# Patient Record
Sex: Female | Born: 1935 | Race: White | Hispanic: No | Smoking: Never smoker
Health system: Southern US, Community
[De-identification: ages and names within clinical notes are randomized; demographics above are authoritative.]

## PROBLEM LIST (undated history)

## (undated) DIAGNOSIS — K219 Gastro-esophageal reflux disease without esophagitis: Secondary | ICD-10-CM

## (undated) DIAGNOSIS — K5792 Diverticulitis of intestine, part unspecified, without perforation or abscess without bleeding: Secondary | ICD-10-CM

## (undated) DIAGNOSIS — K429 Umbilical hernia without obstruction or gangrene: Secondary | ICD-10-CM

## (undated) DIAGNOSIS — T7840XA Allergy, unspecified, initial encounter: Secondary | ICD-10-CM

## (undated) DIAGNOSIS — C541 Malignant neoplasm of endometrium: Secondary | ICD-10-CM

## (undated) DIAGNOSIS — D649 Anemia, unspecified: Secondary | ICD-10-CM

## (undated) DIAGNOSIS — G20 Parkinson's disease: Secondary | ICD-10-CM

## (undated) DIAGNOSIS — R251 Tremor, unspecified: Secondary | ICD-10-CM

## (undated) DIAGNOSIS — H269 Unspecified cataract: Secondary | ICD-10-CM

## (undated) DIAGNOSIS — K279 Peptic ulcer, site unspecified, unspecified as acute or chronic, without hemorrhage or perforation: Secondary | ICD-10-CM

## (undated) DIAGNOSIS — G20A1 Parkinson's disease without dyskinesia, without mention of fluctuations: Secondary | ICD-10-CM

## (undated) DIAGNOSIS — E785 Hyperlipidemia, unspecified: Secondary | ICD-10-CM

## (undated) DIAGNOSIS — I10 Essential (primary) hypertension: Secondary | ICD-10-CM

## (undated) DIAGNOSIS — F419 Anxiety disorder, unspecified: Secondary | ICD-10-CM

## (undated) DIAGNOSIS — C50919 Malignant neoplasm of unspecified site of unspecified female breast: Secondary | ICD-10-CM

## (undated) DIAGNOSIS — F32A Depression, unspecified: Secondary | ICD-10-CM

## (undated) DIAGNOSIS — F329 Major depressive disorder, single episode, unspecified: Secondary | ICD-10-CM

## (undated) HISTORY — DX: Malignant neoplasm of endometrium: C54.1

## (undated) HISTORY — DX: Major depressive disorder, single episode, unspecified: F32.9

## (undated) HISTORY — DX: Anxiety disorder, unspecified: F41.9

## (undated) HISTORY — DX: Essential (primary) hypertension: I10

## (undated) HISTORY — DX: Allergy, unspecified, initial encounter: T78.40XA

## (undated) HISTORY — DX: Gastro-esophageal reflux disease without esophagitis: K21.9

## (undated) HISTORY — DX: Hyperlipidemia, unspecified: E78.5

## (undated) HISTORY — DX: Depression, unspecified: F32.A

## (undated) HISTORY — DX: Malignant neoplasm of unspecified site of unspecified female breast: C50.919

## (undated) HISTORY — DX: Anemia, unspecified: D64.9

## (undated) HISTORY — DX: Unspecified cataract: H26.9

## (undated) HISTORY — DX: Umbilical hernia without obstruction or gangrene: K42.9

## (undated) HISTORY — DX: Parkinson's disease: G20

## (undated) HISTORY — DX: Diverticulitis of intestine, part unspecified, without perforation or abscess without bleeding: K57.92

## (undated) HISTORY — DX: Parkinson's disease without dyskinesia, without mention of fluctuations: G20.A1

## (undated) HISTORY — PX: OTHER SURGICAL HISTORY: SHX169

## (undated) HISTORY — DX: Peptic ulcer, site unspecified, unspecified as acute or chronic, without hemorrhage or perforation: K27.9

## (undated) HISTORY — DX: Tremor, unspecified: R25.1

---
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
## 1995-09-05 HISTORY — PX: AXILLARY LYMPH NODE DISSECTION: SHX5229

## 1995-09-05 HISTORY — PX: BREAST LUMPECTOMY: SHX2

## 2005-09-04 HISTORY — PX: UMBILICAL HERNIA REPAIR: SHX196

## 2005-10-26 ENCOUNTER — Ambulatory Visit: Payer: Self-pay | Admitting: Surgery

## 2005-11-09 ENCOUNTER — Other Ambulatory Visit: Payer: Self-pay

## 2005-11-09 ENCOUNTER — Ambulatory Visit: Payer: Self-pay | Admitting: Surgery

## 2006-03-23 ENCOUNTER — Other Ambulatory Visit: Payer: Self-pay

## 2006-03-23 ENCOUNTER — Ambulatory Visit: Payer: Self-pay | Admitting: Surgery

## 2006-03-28 ENCOUNTER — Ambulatory Visit: Payer: Self-pay | Admitting: Surgery

## 2006-09-04 HISTORY — PX: COLONOSCOPY: SHX174

## 2006-09-24 ENCOUNTER — Ambulatory Visit: Payer: Self-pay | Admitting: Unknown Physician Specialty

## 2007-02-27 ENCOUNTER — Ambulatory Visit: Payer: Self-pay | Admitting: Unknown Physician Specialty

## 2010-11-11 ENCOUNTER — Encounter (INDEPENDENT_AMBULATORY_CARE_PROVIDER_SITE_OTHER): Payer: Self-pay | Admitting: *Deleted

## 2010-11-15 NOTE — Letter (Signed)
Summary: New Patient letter  [facility]  [address] [place], [place] [address]   Phone: [phone]  Fax: [phone]       11/11/2010 MRN: [id number]  [facility] [address] [place], [place]  [address]  Dear Angela Savage,  Welcome to the Gastroenterology Division at [facility].    You are scheduled to see Dr.  Jarold Motto on 12-13-10 at 2:45P.M. on the 3rd floor at [facility], [address].  We ask that you try to arrive at our office 15 minutes prior to your appointment time to allow for check-in.  We would like you to complete the enclosed self-administered evaluation form prior to your visit and bring it with you on the day of your appointment.  We will review it with you.  Also, please bring a complete list of all your medications or, if you prefer, bring the medication bottles and we will list them.  Please bring your insurance card so that we may make a copy of it.  If your insurance requires a referral to see a specialist, please bring your referral form from your primary care physician.  Co-payments are due at the time of your visit and may be paid by cash, check or credit card.     Your office visit will consist of a consult with your physician (includes a physical exam), any laboratory testing he/she may order, scheduling of any necessary diagnostic testing (e.g. x-ray, ultrasound, CT-scan), and scheduling of a procedure (e.g. Endoscopy, Colonoscopy) if required.  Please allow enough time on your schedule to allow for any/all of these possibilities.    If you cannot keep your appointment, please call [phone] to cancel or reschedule prior to your appointment date.  This allows [place] the opportunity to schedule an appointment for another patient in need of care.  If you do not cancel or reschedule by 5 p.m. the business day prior to your appointment date, you will be charged a $50.00 late cancellation/no-show fee.    Thank you for  choosing [facility] for your medical needs.  We appreciate the opportunity to care for you.  Please visit [place] at our website  to learn more about our practice.                     Sincerely,                                                             The Gastroenterology Division

## 2010-11-15 NOTE — Letter (Signed)
Summary: New Patient letter  [facility]  [address] [place], [place] [address]   Phone: [phone]  Fax: [phone]       11/11/2010 MRN: [id number]  [facility] [address] [place], [place]  [address]  Dear Ms. Bangert,  Welcome to the Gastroenterology Division at [facility].    You are scheduled to see Dr.  Leone Payor on 12-28-10 at 2:30P.M. on the 3rd floor at [facility], [address].  We ask that you try to arrive at our office 15 minutes prior to your appointment time to allow for check-in.  We would like you to complete the enclosed self-administered evaluation form prior to your visit and bring it with you on the day of your appointment.  We will review it with you.  Also, please bring a complete list of all your medications or, if you prefer, bring the medication bottles and we will list them.  Please bring your insurance card so that we may make a copy of it.  If your insurance requires a referral to see a specialist, please bring your referral form from your primary care physician.  Co-payments are due at the time of your visit and may be paid by cash, check or credit card.     Your office visit will consist of a consult with your physician (includes a physical exam), any laboratory testing he/she may order, scheduling of any necessary diagnostic testing (e.g. x-ray, ultrasound, CT-scan), and scheduling of a procedure (e.g. Endoscopy, Colonoscopy) if required.  Please allow enough time on your schedule to allow for any/all of these possibilities.    If you cannot keep your appointment, please call [phone] to cancel or reschedule prior to your appointment date.  This allows [place] the opportunity to schedule an appointment for another patient in need of care.  If you do not cancel or reschedule by 5 p.m. the business day prior to your appointment date, you will be charged a $50.00 late cancellation/no-show fee.    Thank you for  choosing [facility] for your medical needs.  We appreciate the opportunity to care for you.  Please visit [place] at our website  to learn more about our practice.                     Sincerely,                                                             The Gastroenterology Division

## 2010-12-13 ENCOUNTER — Ambulatory Visit: Payer: Self-pay | Admitting: Gastroenterology

## 2010-12-28 ENCOUNTER — Ambulatory Visit (INDEPENDENT_AMBULATORY_CARE_PROVIDER_SITE_OTHER): Payer: Medicare Other | Admitting: Internal Medicine

## 2010-12-28 ENCOUNTER — Other Ambulatory Visit (INDEPENDENT_AMBULATORY_CARE_PROVIDER_SITE_OTHER): Payer: Medicare Other

## 2010-12-28 ENCOUNTER — Encounter: Payer: Self-pay | Admitting: Internal Medicine

## 2010-12-28 VITALS — BP 116/76 | HR 96 | Ht 63.0 in | Wt 148.0 lb

## 2010-12-28 DIAGNOSIS — K921 Melena: Secondary | ICD-10-CM

## 2010-12-28 DIAGNOSIS — R1032 Left lower quadrant pain: Secondary | ICD-10-CM

## 2010-12-28 DIAGNOSIS — R634 Abnormal weight loss: Secondary | ICD-10-CM

## 2010-12-28 LAB — COMPREHENSIVE METABOLIC PANEL
ALT: 15 U/L (ref 0–35)
AST: 20 U/L (ref 0–37)
Albumin: 3.9 g/dL (ref 3.5–5.2)
CO2: 30 mEq/L (ref 19–32)
Calcium: 9.5 mg/dL (ref 8.4–10.5)
Chloride: 96 mEq/L (ref 96–112)
Creatinine, Ser: 0.7 mg/dL (ref 0.4–1.2)
GFR: 83.61 mL/min (ref >60.00)
Potassium: 3.5 mEq/L (ref 3.5–5.1)
Sodium: 138 mEq/L (ref 135–145)
Total Protein: 7 g/dL (ref 6.0–8.3)

## 2010-12-28 LAB — CBC WITH DIFFERENTIAL/PLATELET
Basophils Absolute: 0 10*3/uL (ref 0.0–0.1)
Hemoglobin: 13.6 g/dL (ref 12.0–15.0)
Lymphocytes Relative: 36.4 % (ref 12.0–46.0)
Monocytes Relative: 8.5 % (ref 3.0–12.0)
Neutro Abs: 5.3 10*3/uL (ref 1.4–7.7)
Neutrophils Relative %: 53.6 % (ref 43.0–77.0)
RDW: 13.6 % (ref 11.5–14.6)

## 2010-12-28 NOTE — Progress Notes (Signed)
Subjective:    Patient ID: Angela Savage, female    DOB: [birth year], 77 y.o.   MRN: [id number]  HPI This is a very pleasant 77 year old white woman with chronic recurrent left lower quadrant pain. She clearly remembers developing problems after an umbilical hernia repair in 2007. She waited the appropriate several months after her August 2007 repair and then was doing some yard work and remembers feeling something tearing in her abdomen. At some point after that she was diagnosed with diverticulitis though it took a pelvic ultrasound to do so. She was well after antibiotic treatment once or twice for a number of years until the past several months where she began to have problems again. It should be noted that she had a colonoscopy in 2008 after her diverticulitis attack was suspected and diagnosed but there were no diverticula seen at the colonoscopy.  There is a very focal area of left lower quadrant pain and tenderness. She says when pressing on that area she will subsequently developed rectal bleeding or hematochezia at times. Her bowel habits have generally been regular without any problem. When the pain comes she gets low-grade fevers and chills and feels fairly often she says. When she does take antibiotics to improve and usually resolve things. She says she is terribly allergic to Cipro sulfa and Flagyl will help. She had seen a surgeon, Dr. Katrinka Blazing in [place] recently and he was discussing the possibility of a segmental colon resection it sounds like. I believe a CT scan was recommended that she has not pursued that and had not had one in some time. He is concerned that she might have cancer again having had breast cancer and what I think is endometrial cancer. The last antibiotic she had were in early March.  Past medical history, past surgical history, family history and social history reviewed and reflected in the database. She does report that her family is cared for by me, she is retired  but still very active and cares for her own house and drives etc. 4 sons 2 daughters.  Review of Systems She feels fatigued at times, low-grade temperature elevations the number not given, all other review of systems reviewed and reported as negative.    Objective:   Physical Exam Present N. well-developed overweight elderly white woman no acute distress, vigorous overall. Eyes anicteric pupils round and reactive to light Mouth posterior pharynx clear lesions she does have dentures Neck supple no mass thyromegaly Lungs clear Heart S1-S2 no murmurs Abdomen is protuberant somewhat obese soft there is a focal area of tenderness in the left lower quadrant area towards the pelvis. She is tender and there is some guarding there. There is no change in the pain with manipulation of the hip or lower extremity i.e. it does not cause that. Rectal exam is deferred. Extremities free of edema She is oriented x3 Mood and affect are appropriate        Assessment & Plan:  #1 is left lower quadrant pain  Etiology of this is not clear. It certainly could be some type of occult diverticulitis it is hard to prove. At this point repeat CT scanning as the first step I believe. If that is unrevealing a colonoscopy will be pursued because of the hematochezia issues as well. If it does show diverticulitis I would treat with a regimen again. If she has diverticulitis on a recurrent basis segmental resection seems reasonable although she was older and we would try to avoid this. To me, it  is really not clear what the cause of her problems are and we will need to investigate further.  #2 blood in stool colonoscopy is anticipated pending the CT scan results she understands and is aware  Cc: Dr. Lacie Scotts

## 2010-12-28 NOTE — Patient Instructions (Signed)
Please got to the basement lab today after checking out for labs. You have been scheduled for a CT scan at [facility] care on [facility]. Instructions have been provided.  Please stop the Metformin the day of your procedure and do not start again until 48 hours later.

## 2010-12-30 ENCOUNTER — Ambulatory Visit (INDEPENDENT_AMBULATORY_CARE_PROVIDER_SITE_OTHER)
Admission: RE | Admit: 2010-12-30 | Discharge: 2010-12-30 | Disposition: A | Discharge status: Home / Self Care | Payer: Medicare Other | Source: Ambulatory Visit | Attending: Internal Medicine | Admitting: Internal Medicine

## 2010-12-30 DIAGNOSIS — K921 Melena: Secondary | ICD-10-CM

## 2010-12-30 DIAGNOSIS — R1032 Left lower quadrant pain: Secondary | ICD-10-CM

## 2010-12-30 DIAGNOSIS — R634 Abnormal weight loss: Secondary | ICD-10-CM

## 2010-12-30 MED ORDER — IOHEXOL 300 MG/ML  SOLN
100.0000 mL | Freq: Once | INTRAMUSCULAR | Status: AC | PRN
  Administered 2010-12-30: 100 mL via INTRAVENOUS

## 2011-01-03 ENCOUNTER — Telehealth: Payer: Self-pay

## 2011-01-03 ENCOUNTER — Encounter: Payer: Self-pay | Admitting: Internal Medicine

## 2011-01-03 DIAGNOSIS — K921 Melena: Secondary | ICD-10-CM

## 2011-01-03 NOTE — Telephone Encounter (Signed)
Message copied by Darcey Nora on Tue Jan 03, 2011  9:58 AM ------      Message from: Stan Head      Created: Fri Dec 30, 2010 12:16 PM       No diverticulitis evident.      Suspected capsule in the left colon (see report).      I need her to schedule a colonoscopy to evaluate LLQ pain and she has also had blood in stool 578.1

## 2011-01-03 NOTE — Telephone Encounter (Signed)
Patient advised she is scheduled for colon 02/01/11 1:30, pre-visit 01/23/11 10:30

## 2011-01-03 NOTE — Telephone Encounter (Signed)
Message copied by Darcey Nora on Tue Jan 03, 2011 10:03 AM ------      Message from: Stan Head      Created: Fri Dec 30, 2010 12:16 PM       No diverticulitis evident.      Suspected capsule in the left colon (see report).      I need her to schedule a colonoscopy to evaluate LLQ pain and she has also had blood in stool 578.1

## 2011-01-03 NOTE — Telephone Encounter (Signed)
Message copied by Darcey Nora on Tue Jan 03, 2011 10:02 AM ------      Message from: Stan Head      Created: Fri Dec 30, 2010 12:16 PM       No diverticulitis evident.      Suspected capsule in the left colon (see report).      I need her to schedule a colonoscopy to evaluate LLQ pain and she has also had blood in stool 578.1

## 2011-01-04 ENCOUNTER — Encounter: Payer: Self-pay | Admitting: Internal Medicine

## 2011-01-23 ENCOUNTER — Ambulatory Visit (AMBULATORY_SURGERY_CENTER): Payer: Medicare Other | Admitting: *Deleted

## 2011-01-23 ENCOUNTER — Encounter: Payer: Self-pay | Admitting: Internal Medicine

## 2011-01-23 VITALS — Ht 62.0 in | Wt 147.5 lb

## 2011-01-23 DIAGNOSIS — K921 Melena: Secondary | ICD-10-CM

## 2011-01-23 MED ORDER — PEG-KCL-NACL-NASULF-NA ASC-C 100 G PO SOLR: 1.0000 | Freq: Once | ORAL | Status: AC

## 2011-01-23 NOTE — Progress Notes (Signed)
Pt states she gave Dr. Leone Payor her previous colonoscopy report at her office visit with him on 12-28-10.

## 2011-02-01 ENCOUNTER — Other Ambulatory Visit: Payer: Medicare Other | Admitting: Internal Medicine

## 2011-03-02 ENCOUNTER — Other Ambulatory Visit: Payer: Medicare Other | Admitting: Internal Medicine

## 2011-03-07 ENCOUNTER — Other Ambulatory Visit: Payer: Medicare Other | Admitting: Internal Medicine

## 2011-10-31 ENCOUNTER — Ambulatory Visit: Payer: Medicare Other | Admitting: Internal Medicine

## 2011-10-31 DIAGNOSIS — Z0289 Encounter for other administrative examinations: Secondary | ICD-10-CM

## 2014-03-19 ENCOUNTER — Inpatient Hospital Stay: Payer: Self-pay | Admitting: Specialist

## 2014-03-19 LAB — CBC WITH DIFFERENTIAL/PLATELET
BASOS PCT: 0.7 %
Basophil #: 0.1 10*3/uL (ref 0.0–0.1)
Eosinophil #: 0.1 10*3/uL (ref 0.0–0.7)
Eosinophil %: 0.6 %
HCT: 36.8 % (ref 35.0–47.0)
HGB: 12.2 g/dL (ref 12.0–16.0)
LYMPHS ABS: 1.6 10*3/uL (ref 1.0–3.6)
Lymphocyte %: 12.4 %
MCH: 27.9 pg (ref 26.0–34.0)
MCHC: 33.1 g/dL (ref 32.0–36.0)
MCV: 84 fL (ref 80–100)
MONOS PCT: 6.4 %
Monocyte #: 0.8 x10 3/mm (ref 0.2–0.9)
NEUTROS PCT: 79.9 %
Neutrophil #: 10.2 10*3/uL — ABNORMAL HIGH (ref 1.4–6.5)
PLATELETS: 321 10*3/uL (ref 150–440)
RBC: 4.36 10*6/uL (ref 3.80–5.20)
RDW: 13.9 % (ref 11.5–14.5)
WBC: 12.8 10*3/uL — ABNORMAL HIGH (ref 3.6–11.0)

## 2014-03-19 LAB — COMPREHENSIVE METABOLIC PANEL
ALBUMIN: 3.5 g/dL (ref 3.4–5.0)
ALT: 21 U/L (ref 12–78)
ANION GAP: 11 (ref 7–16)
Alkaline Phosphatase: 55 U/L
BILIRUBIN TOTAL: 0.4 mg/dL (ref 0.2–1.0)
BUN: 15 mg/dL (ref 7–18)
CREATININE: 1.19 mg/dL (ref 0.60–1.30)
Calcium, Total: 8.9 mg/dL (ref 8.5–10.1)
Chloride: 97 mmol/L — ABNORMAL LOW (ref 98–107)
Co2: 28 mmol/L (ref 21–32)
EGFR (Non-African Amer.): 43 — ABNORMAL LOW
GFR CALC AF AMER: 50 — AB
GLUCOSE: 205 mg/dL — AB (ref 65–99)
OSMOLALITY: 279 (ref 275–301)
POTASSIUM: 2.9 mmol/L — AB (ref 3.5–5.1)
SGOT(AST): 27 U/L (ref 15–37)
Sodium: 136 mmol/L (ref 136–145)
TOTAL PROTEIN: 7.5 g/dL (ref 6.4–8.2)

## 2014-03-19 LAB — PROTIME-INR
INR: 0.9
Prothrombin Time: 12.1 secs (ref 11.5–14.7)

## 2014-03-19 LAB — APTT: ACTIVATED PTT: 29.7 s (ref 23.6–35.9)

## 2014-03-19 LAB — TROPONIN I

## 2014-03-19 LAB — MAGNESIUM: MAGNESIUM: 1.4 mg/dL — AB

## 2014-03-20 LAB — URINALYSIS, COMPLETE
BILIRUBIN, UR: NEGATIVE
Bacteria: NONE SEEN
Glucose,UR: NEGATIVE mg/dL (ref 0–75)
Nitrite: NEGATIVE
PH: 5 (ref 4.5–8.0)
Protein: NEGATIVE
SQUAMOUS EPITHELIAL: NONE SEEN
Specific Gravity: 1.013 (ref 1.003–1.030)

## 2014-03-20 LAB — CBC WITH DIFFERENTIAL/PLATELET
BASOS PCT: 0.4 %
Basophil #: 0 10*3/uL (ref 0.0–0.1)
EOS ABS: 0.2 10*3/uL (ref 0.0–0.7)
EOS PCT: 2.2 %
HCT: 34.3 % — ABNORMAL LOW (ref 35.0–47.0)
HGB: 11.2 g/dL — ABNORMAL LOW (ref 12.0–16.0)
LYMPHS PCT: 26.1 %
Lymphocyte #: 2.7 10*3/uL (ref 1.0–3.6)
MCH: 27.8 pg (ref 26.0–34.0)
MCHC: 32.8 g/dL (ref 32.0–36.0)
MCV: 85 fL (ref 80–100)
MONOS PCT: 10.7 %
Monocyte #: 1.1 x10 3/mm — ABNORMAL HIGH (ref 0.2–0.9)
NEUTROS ABS: 6.4 10*3/uL (ref 1.4–6.5)
Neutrophil %: 60.6 %
Platelet: 280 10*3/uL (ref 150–440)
RBC: 4.04 10*6/uL (ref 3.80–5.20)
RDW: 13.8 % (ref 11.5–14.5)
WBC: 10.5 10*3/uL (ref 3.6–11.0)

## 2014-03-20 LAB — BASIC METABOLIC PANEL
ANION GAP: 4 — AB (ref 7–16)
BUN: 10 mg/dL (ref 7–18)
CALCIUM: 8.4 mg/dL — AB (ref 8.5–10.1)
CO2: 29 mmol/L (ref 21–32)
CREATININE: 0.84 mg/dL (ref 0.60–1.30)
Chloride: 102 mmol/L (ref 98–107)
GLUCOSE: 109 mg/dL — AB (ref 65–99)
Osmolality: 270 (ref 275–301)
Potassium: 3.5 mmol/L (ref 3.5–5.1)
SODIUM: 135 mmol/L — AB (ref 136–145)

## 2014-03-20 LAB — MAGNESIUM: MAGNESIUM: 2.2 mg/dL

## 2014-03-21 LAB — BASIC METABOLIC PANEL
ANION GAP: 7 (ref 7–16)
BUN: 8 mg/dL (ref 7–18)
CALCIUM: 7.8 mg/dL — AB (ref 8.5–10.1)
Chloride: 101 mmol/L (ref 98–107)
Co2: 26 mmol/L (ref 21–32)
Creatinine: 0.88 mg/dL (ref 0.60–1.30)
GLUCOSE: 188 mg/dL — AB (ref 65–99)
Osmolality: 272 (ref 275–301)
Potassium: 3.6 mmol/L (ref 3.5–5.1)
SODIUM: 134 mmol/L — AB (ref 136–145)

## 2014-03-21 LAB — CBC WITH DIFFERENTIAL/PLATELET
BASOS PCT: 0.4 %
Basophil #: 0 10*3/uL (ref 0.0–0.1)
Eosinophil #: 0.1 10*3/uL (ref 0.0–0.7)
Eosinophil %: 1.1 %
HCT: 26.8 % — AB (ref 35.0–47.0)
HGB: 9.1 g/dL — AB (ref 12.0–16.0)
Lymphocyte #: 2.1 10*3/uL (ref 1.0–3.6)
Lymphocyte %: 20.9 %
MCH: 28.8 pg (ref 26.0–34.0)
MCHC: 34 g/dL (ref 32.0–36.0)
MCV: 85 fL (ref 80–100)
MONO ABS: 1.1 x10 3/mm — AB (ref 0.2–0.9)
Monocyte %: 10.9 %
Neutrophil #: 6.8 10*3/uL — ABNORMAL HIGH (ref 1.4–6.5)
Neutrophil %: 66.7 %
PLATELETS: 250 10*3/uL (ref 150–440)
RBC: 3.16 10*6/uL — AB (ref 3.80–5.20)
RDW: 13.7 % (ref 11.5–14.5)
WBC: 10.2 10*3/uL (ref 3.6–11.0)

## 2014-03-22 LAB — HEMOGLOBIN: HGB: 9 g/dL — ABNORMAL LOW (ref 12.0–16.0)

## 2014-11-11 ENCOUNTER — Inpatient Hospital Stay: Payer: Self-pay | Admitting: Internal Medicine

## 2014-12-26 NOTE — Discharge Summary (Signed)
PATIENT NAME:  Angela Savage, BRIAN MR#:  [id number] DATE OF BIRTH:  1931-09-24  DATE OF ADMISSION:  03/19/2014 DATE OF DISCHARGE:  03/24/2014   For detailed note, please see the history and physical done on admission by Dr. Loletha Grayer.  DIAGNOSES AT DISCHARGE:  1. Status post fall and left hip fracture.  2. Hypertension.  3. Diabetes.  4. Anxiety.  5. Osteoporosis.  6. Otitis media.   DIET: The patient is being discharged on a low-sodium, American Diabetic Association diet.   ACTIVITY: As tolerated.   FOLLOWUP: Dr. Earnestine Leys in the next 1-2 weeks. Follow up with patient's primary care physician, Dr. Lahoma Crocker, in the next 1-2 weeks.    DISCHARGE MEDICATIONS: Glimepiride 4 mg b.i.d., Xanax 1 mg 1/2 tab b.i.d., metformin 1000 mg b.i.d., Lantus 20 units at bedtime, lisinopril 2.5 mg at bedtime, metoprolol succinate 25 mg daily, aspirin 325 mg b.i.d., calcium/vitamin D 1 tab b.i.d., Augmentin 875 mg b.i.d. x 3 days, Tylenol with hydrocodone 5/325, 1 tab q.4-6 hours as needed, iron sulfate 325 mg daily, and milk of magnesia 30 mL b.i.d. as needed.   [place] COURSE: Dr. Earnestine Leys, Dr. Thornton Park, orthopedics.   PERTINENT STUDIES DONE DURING THE HOSPITAL COURSE: Are as follows: A CT of the head done without contrast on admission, showing no acute abnormality. An x-ray of the left hip, showing intertrochanteric fracture of the left femur with slight angulation. Chest x-ray on admission showing no acute cardiopulmonary disease.   HOSPITAL COURSE: This is a 81 year old female who presented to the hospital on 03/19/2014 due to a mechanical fall and noted to have a left hip fracture.   1.  Status post fall and left hip fracture. This was likely a mechanical fall that led to the patient's left hip fracture. The patient was seen by orthopedics and underwent open reduction and internal fixation of her left hip. She is currently postoperative day #4. She is  working well with physical therapy. Her pain is well-controlled with some as needed Norco. She is being discharged to a skilled nursing facility for ongoing rehab. She was on Lovenox for DVT prophylaxis, but as per orthopedics, they wanted to discharge her on aspirin twice daily, which she will continue to take for DVT prophylaxis. 2.  Hypokalemia. This was secondary to the hydrochlorothiazide that the patient was taking. Her HCTZ has now been discontinued. Her potassium has since then been supplemented and is now normalized.  3.  Otitis media. The patient has been maintained on some Augmentin. She will continue that for the next 2 or 3 days. She is clinically afebrile and stable.  4.  Diabetes. The patient's blood sugars have remained stable. She will continue her metformin, glimepiride, and Lantus as stated.  5.  Hypertension. The patient's blood pressure was bit on the lower side. Her antihypertensive medications have been adjusted. She will continue to take her low-dose metoprolol and lisinopril and has been taken off the hydrochlorothiazide.  6.  Anxiety. The patient was maintained on her Xanax, and she will continue that.  7.  Osteoporosis. The patient is being discharged on calcium and vitamin D supplements.   The patient is a FULL CODE.   DISPOSITION: She is being discharged to a skilled nursing facility for ongoing rehab.   TIME SPENT: 40 minutes.    ____________________________ Belia Heman. Verdell Carmine, MD vjs:je D: 03/24/2014 10:56:00 ET T: 03/24/2014 11:19:45 ET JOB#: [id number]  cc: Belia Heman. Verdell Carmine, MD, <Dictator> Lahoma Crocker, MD Nadara Mustard  Dinah Beers, MD    Henreitta Leber MD ELECTRONICALLY SIGNED 04/02/2014 14:05

## 2014-12-26 NOTE — H&P (Signed)
PATIENT NAME:  Angela Savage, Angela Savage MR#:  [id number] DATE OF BIRTH:  02/16/1932  DATE OF ADMISSION:  03/19/2014  PRIMARY CARE PHYSICIAN:  Dr. Margret Chance at [facility] in [place].   CHIEF COMPLAINT: Hip pain after a fall.   HISTORY OF PRESENT ILLNESS: This is a 81 year old female who was out pulling weeds in the yard.  She bent over and then got up quickly, got dizzy, everything was spinning. She started leaning towards the left and then fell on her left hip and had pain.  In the ER, she was found to have a left hip fracture, and hospitalist services were contacted for further evaluation.   PAST MEDICAL HISTORY: Diabetes; right breast cancer with lymph node resection treated with lumpectomy, radiation and hormonal therapy; recent kidney infection; hypertension; and tachycardia.   PAST SURGICAL HISTORY: Breast lumpectomy and lymph node resection, hysterectomy, tumor of the bladder, and hernia repair.   ALLERGIES: CIPRO AND CODEINE.   MEDICATIONS: As per prescription writer include aspirin 81 mg daily, glimepiride 4 mg twice a day, hydrochlorothiazide 25 mg daily, Lantus 20 units subcutaneous injection in the morning, lisinopril 10 mg daily, metformin 1000 mg twice a day, metoprolol 500 mg extended-release daily, Xanax 1 mg half tablet twice a day.   SOCIAL HISTORY: No smoking. No alcohol. No drug use.  Used to work in [employer]. Lives with her sister.   FAMILY HISTORY: Father died with a hip fracture and surgery. Mother died of a CVA, also  had hypertension.  REVIEW OF SYSTEMS:  CONSTITUTIONAL: No fever, chills, or sweats. Positive for weakness. No fatigue.  EYES: She does wear glasses.  EARS, NOSE, MOUTH AND THROAT: Decreased hearing. Positive for postnasal drip. No sore throat. No difficulty swallowing.  CARDIOVASCULAR: No chest pain. No palpitations.  RESPIRATORY: Positive for cough, whitish phlegm.  GASTROINTESTINAL: No nausea. No vomiting. No abdominal pain. No diarrhea. No  constipation. No bright red blood per rectum. No melena.  GENITOURINARY: No burning on urination. No hematuria.  MUSCULOSKELETAL: Positive for hip pain.  INTEGUMENT: No rashes or eruptions.  NEUROLOGIC: No fainting or blackouts.  PSYCHIATRIC: No anxiety or depression.  ENDOCRINE: No thyroid problems.  HEMATOLOGIC AND LYMPHATIC: No anemia. No easy bruising or bleeding.   PHYSICAL EXAMINATION:  VITAL SIGNS: Temperature 98.1, pulse 102, respirations 18, blood pressure 136/86, pulse oximetry 94% on room air.  GENERAL: No respiratory distress.  EYES: Conjunctivae and lids normal. Pupils equal, round, and reactive to light. Extraocular muscles intact. No nystagmus.  EARS, NOSE, MOUTH AND THROAT: Tympanic membrane bulging and erythematous bilaterally. Nasal mucosa: No erythema.  THROAT: Slight erythema, no exudate seen.  LIPS AND GUMS: No lesions.  NECK: No JVD. No bruits. No lymphadenopathy. No thyromegaly. No thyroid nodules palpated.  LUNGS: Clear to auscultation. No use of accessory muscles to breathe. No rhonchi rales, or wheeze heard.  CARDIOVASCULAR: S1, S2 normal.  Positive 2/6 systolic ejection murmur. Carotid upstroke 2+ bilaterally. No bruits.  EXTREMITIES: Dorsalis pedis pulses 2+ bilaterally. No edema of the lower extremity.  ABDOMEN: Soft, nontender. No organosplenomegaly. Normoactive bowel sounds. No masses felt.  LYMPHATIC: No lymph nodes in the neck.  MUSCULOSKELETAL: No clubbing, edema, or cyanosis.  SKIN: No ulcers or lesions seen.  NEUROLOGICAL: Cranial nerves II-XII grossly intact. Deep tendon reflexes not tested with hip fracture.  PSYCHIATRIC: The patient is oriented to person, place, and time.   LABORATORY AND RADIOLOGICAL DATA: CT scan of the head was negative. Chest x-ray negative. Left hip showed a hip  fracture. PT, INR and PTT normal range. White blood cell count 12.8, hemoglobin and hematocrit 12.2 and 36.8, platelet count of 321,000. Glucose 205, BUN 15, creatinine  1.19, sodium 136, potassium 2.9, chloride 97, CO2 28, calcium 8.9. Liver function tests normal range. Troponin negative.   EKG: No acute changes.   ASSESSMENT AND PLAN:  1.  Preoperative evaluation for left hip fracture. I see no contraindications to do surgery at this time. The patient is willing to undergo all of the risks of surgery in order to walk again, as soon as possible. The patient is on weight-bearing bones, especially hips.  Must go to the operating room to avoid complications of skin breakdown and deep venous thrombosis, pneumonia, bronchitis, and death if they do not get up and walk again. The patient is willing to go for the procedure.   2.  Hypokalemia. I will stop hydrochlorothiazide, likely the cause of the patient's hypokalemia. I will check a magnesium and replace that if low.  I will give potassium in IV fluids and oral potassium.   3.  Otitis media. We will give Augmentin b.i.d. .  Likely the cause of her dizziness and fall.   4.  Diabetes. We will hold diabetic medications since the patient may end up going to the operating room and just cover with sliding scale at this point.   5.  Hypertension. Continue metoprolol.   6.  Tachycardia.  Continue metoprolol. Tachycardia could also be secondary to pain.   TIME SPENT ON ADMISSION: 55 minutes.   CODE STATUS: The Patient Is A Full Code, But Does Not Want To Be On Life Support Long-Term.   ____________________________ Tana Conch. Leslye Peer, MD rjw:ts D: 03/19/2014 17:37:37 ET T: 03/19/2014 18:10:05 ET JOB#: [id number]  cc: Tana Conch. Leslye Peer, MD, <Dictator> Dr. Waylan Rocher at [facility], [place]. Marisue Brooklyn MD ELECTRONICALLY SIGNED 03/20/2014 12:37

## 2014-12-26 NOTE — Consult Note (Signed)
Brief Consult Note: Diagnosis: Left intertrochanteric hip fracture.   Patient was seen by consultant.   Recommend to proceed with surgery or procedure.   Recommend further assessment or treatment.   Orders entered.   Discussed with Attending MD.   Comments: 81 year old female fell today injuring the left hip.  Brought to Emergency Room where exam and X-rays show a base neck/intertrochanteric fracture left hip.  Admitted for medical evaluation and surgery.  Discussed surgery with patient and family. Risks and benefits of surgery were discussed at length including but not limited to infection, non union, nerve or blood vessed damage, non union, need for repeat surgery, blood clots and lung emboli, and death.. Plan surgery tomorrow if cleared.   Exam:  Alert and comfortable.  circulation/sensation/motor function good. Pain with range of motion.  Short and rotated.  Skin intact.  No other complaints.   X-rays:  as above  Imp:  Displaced intertrochanteric fracture left hip  Rx:  open reduction and internal fixation left hip.  Electronic Signatures: Park Breed (MD)  (Signed 16-Jul-15 18:13)  Authored: Brief Consult Note   Last Updated: 16-Jul-15 18:13 by Park Breed (MD)

## 2014-12-26 NOTE — Op Note (Signed)
PATIENT NAME:  Angela Savage, Angela Savage MR#:  [id number] DATE OF BIRTH:  06-25-1932  DATE OF PROCEDURE:  03/20/2014  PREOPERATIVE DIAGNOSIS: Displaced intertrochanteric/base neck fracture, left hip.   POSTOPERATIVE DIAGNOSIS:  Displaced intertrochanteric/base neck fracture, left hip.   PROCEDURE PERFORMED: Open reduction internal fixation left hip with a Synthes dynamic hip compression screw (145 degrees/4 hole plate, 90 mm lag screw, 4 cortical screws).   SURGEON: Park Breed, MD.   ANESTHESIA: Spinal.   COMPLICATIONS: None.   DRAINS: Two Hemovacs.   ESTIMATED BLOOD LOSS: 200 mL.  REPLACEMENT: None.   DESCRIPTION OF PROCEDURE: The patient was brought to the operating room where she underwent satisfactory spinal anesthesia, was placed on the fracture table. The right leg was flexed and abducted and her left leg was placed in traction and internally rotated. Fluoroscopy showed excellent reduction of the fracture. The hip was prepped and draped in sterile fashion and longitudinal incision was made along the proximal left thigh. Dissection was carried out sharply through the subcutaneous tissue and fascia and the vastus lateralis muscle was elevated off the femur from posteriorly. A quarter-inch drill hole was made in the lateral femur and a guidepin inserted at 145 degrees into good position of the head and neck. The step cut reamer was used to enlarge the opening. A 90 mm compression screw with a 145 degree 4-hole plate was inserted. The plate was affixed to the shaft with 4 screws. Traction was released and compression screw inserted to tighten the fracture. The fluoroscopy showed good position of the hardware and fracture on AP and lateral views. The wound was irrigated. The fascia was closed with #2  Quill over a medium Hemovac and the subcutaneous tissue was closed with 0 Quill over another Hemovac with irrigation at each level. The skin was closed with staples. Dry sterile dressing was applied  and the Hemovac was activated. The patient was taken out of traction. She had good motion of the hip without crepitus. She was transferred to her hospital bed and taken to recovery in good condition.    ____________________________ Park Breed, MD hem:ds D: 03/20/2014 15:01:45 ET T: 03/20/2014 22:15:14 ET JOB#: [id number]  cc: Park Breed, MD, <Dictator> Park Breed MD ELECTRONICALLY SIGNED 03/22/2014 22:34

## 2015-01-03 NOTE — Discharge Summary (Signed)
PATIENT NAME:  Angela Savage, Angela Savage MR#:  [id number] DATE OF BIRTH:  1932-08-26  DATE OF ADMISSION:  11/11/2014 DATE OF DISCHARGE:  11/13/2014  ADMITTING DIAGNOSIS: Acute pancreatitis.  DISCHARGE DIAGNOSES:  1.  Systemic inflammatory response syndrome due to acute pancreatitis.  2.  Acute pancreatitis of unclear etiology.  3.  Recurrent right-sided abdominal pain with HIDA scan revealing abnormal low gallbladder ejection fraction with patent cystic duct.  4.  Leukocytosis, not otherwise specified.  5.  Hypokalemia. 6.  History of diabetes mellitus, type 2, without complications, insulin-dependent.  7.  Essential hypertension.  8.  Chronic tremors.   DISCHARGE CONDITION: Stable.   DISCHARGE MEDICATIONS: The patient is to resume: Glimepiride 4 mg twice daily, metformin 1 gram twice daily, Lantus insulin 20 units subcutaneously daily, lisinopril 2.5 mg p.o. at bedtime, calcium with vitamin D 500/200 one tablet twice daily with meals, metoprolol succinate 25 mg twice daily, acetaminophen/hydrocodone 325 mg/5 mg 1 tablet every 4-6 hours as needed, iron sulfate 325 mg p.o. daily, magnesium hydroxide 8% oral suspension 30 mL twice daily as needed, aspirin 325 mg twice daily, Xanax 1 mg 1/2 tablet twice daily, and Augmentin 875/125 mg 1 tablet twice daily for 10 days.   HOME OXYGEN: None.   DIET: Two gram salt, low-fat, low-cholesterol, carbohydrate -controlled diet, regular consistency.   ACTIVITY LIMITATIONS: As tolerated.   FOLLOWUP APPOINTMENT: Dr. Lahoma Crocker in 2 days after discharge, Dr. Vira Agar in 1 week after discharge, and Dr. Pat Patrick in 1 week after discharge.    CONSULTANTS: Care management, social work.   RADIOLOGIC STUDIES: CT scan of abdomen and pelvis with contrast, 11/10/2014, showing infiltration around the head of the pancreas suggesting early changes of acute pancreatitis. No fluid collections were noted. Nonspecific gallbladder distention. No bile duct dilatation was noted.  Ultrasound of right upper quadrant, 11/11/2014, showing unremarkable right upper quadrant ultrasound. HIDA scan results not yet as documented by the radiologist; however, according to discussion with radiologist, the patient does have patent cystic duct and abnormally low, 8% ejection fraction.   HOSPITAL COURSE: The patient is an 81 year old Caucasian female with a past history significant for a history diverticulitis in the past, who presents to the hospital with complaints of periumbilical abdominal pain associated nausea and vomiting. Please refer to Dr. Ephriam Jenkins admission note on 11/11/2014. On arrival to the hospital, the patient's temperature was 97.9, pulse was 74, respiration rate was 22, blood pressure 160/86, saturation was 94% on room air. Physical exam revealed mild diffuse tenderness in the abdomen, but no guarding, no rigidity. Bowel sounds were present and equal. Otherwise, physical exam was unremarkable. The patient's lab data done on arrival to the Emergency Room showed elevated glucose level of 213, potassium of 3.1, otherwise BMP was unremarkable. The patient's lipase level was elevated at 784. Lactic acid level was 2.1. The patient's liver enzymes revealed mild elevation of AST to 48, otherwise unremarkable liver enzyme testing. Troponin was less than 0.03. White blood cell count was elevated at 17.8, hemoglobin was 12.1, and platelet count was 297,000, absolute neutrophil count was elevated at 14.6. Urinalysis was remarkable for 1+ blood, negative for protein, no nitrites, trace leukocyte esterase, 5 red blood cells, 3 white blood cells, no bacteria were seen. EKG showed atrial flutter at 79 beats per minutes with variable AV block; septal infarct, age undetermined; abnormal EKG. The patient was admitted to the hospital for further evaluation. She was started on IV fluids and her condition overall improved as time progressed. She was kept  n.p.o. because of acute pancreatitis. When her lipase  level decreased to normal at 33 on 11/12/2014, she was introduced to a clear liquid diet. She tolerated a clear liquid diet well; however, when she was placed on a regular diet she developed recurrent right-sided going to the left side abdominal discomfort. At that point, she had an HIDA scan done, which showed abnormally low ejection fraction. It was felt that the patient has had pancreatitis, which resolved. She was advised, however, to follow up with Dr. Vira Agar as well as surgeon for further recommendations in regard to her abnormally low gallbladder ejection fraction, possible gallbladder dyskinesia, which is possibly the cause of the patient's recurrent abdominal pains.   In regard to white blood cell count elevation, the patient is to continue antibiotic therapy for 8 days to complete a 10-day course although, again, there was no indication that there was any infection in her abdomen. However, according to the patient, she requested antibiotic because of concern of possible recurrent diverticulitis.   In regard to hypokalemia, patient's potassium level was supplemented and it normalized. For her chronic medical problems such as diabetes, hypertension, and chronic tremors, the patient is to continue her outpatient medications, no changes were made. The patient is being discharged in stable condition with the above-mentioned medications and followup. On the day of discharge, temperature was 98.5, pulse was 93, respiration rate was 20, blood pressure 156/82, saturation was 94% on room air at rest.   TIME SPENT: 40 minutes.    ____________________________ Theodoro Grist, MD rv:bm D: 11/13/2014 19:28:16 ET T: 11/14/2014 07:28:44 ET JOB#: [id number]  cc: Theodoro Grist, MD, <Dictator> Manya Silvas, MD Micheline Maze, MD Lahoma Crocker, MD [place] MD ELECTRONICALLY SIGNED 12/06/2014 16:16

## 2015-01-03 NOTE — H&P (Signed)
PATIENT NAME:  Angela Savage, Angela Savage MR#:  [id number] DATE OF BIRTH:  Sep 23, 1931  DATE OF ADMISSION:  11/11/2014  REFERRING PHYSICIAN:  Valli Glance. Owens Shark, MD   PRIMARY CARE PRACTITIONER:  Myles Rosenthal. Mendel Ryder, MD  ADMITTING PHYSICIAN:  Juluis Mire, MD   CHIEF COMPLAINT:  Periumbilical abdominal pain with associated nausea and vomiting.   HISTORY OF PRESENT ILLNESS: An 81 year old Caucasian female with a past medical history of hypertension, diabetes mellitus type 2, angina, osteoporosis, and chronic tremors, who presents to the Emergency Room with the complaints of periumbilical abdominal pain with associated nausea and vomiting, which started around 4:00 p.m. yesterday. The patient states that she has been having some issues with constipation, for which she took [place] the previous night, following which she just had a desire to go to the bathroom. She went to use the restroom to have a bowel movement and during that time she noticed severe periumbilical abdominal pain with associated nausea and vomiting, hence was brought to the Emergency Room for further evaluation. Denies any fever or chills.   In the Emergency Room, the patient was evaluated by the ED physician and was noted to have labs significant for elevated lipase and a CT of the abdomen and the pelvis consistent with peripancreatic edema suggestive of early pancreatitis. The patient was given IV pain medications and IV fluids, following which her abdominal pain is currently under control. Denies any history of fever or chills. No blood in the stools. No chest pain. No shortness of breath. No cough. No palpitations. No dizziness. She has had some chronic frequency of urination for the past few months. Denies any dysuria.   PAST MEDICAL HISTORY: 1.  Hypertension.  2.  Diabetes mellitus type 2.  3.  Questionable angina.  4.  Chronic tremors.  5.  History of right breast cancer status post radiation and surgery in the past.   PAST  SURGICAL HISTORY: 1.  Hip replacement.  2.  Right breast surgery.  3.  Hysterectomy.   ALLERGIES:  CIPRO AND CODEINE.   FAMILY HISTORY:  Father died from complications from hip surgery and mother with hypertension and CVA.   SOCIAL HISTORY:  She lives alone at home. [employer]. Denies any history of smoking, alcohol, or substance abuse.   HOME MEDICATIONS:  1.  Aspirin 325 mg 1 tablet orally once a day.  2.  Calcium with vitamin D 1 tablet once a day.  3.  Ferrous sulfate 325 mg 1 tablet orally once a day.  4.  Glimepiride 4 mg oral tablet 1 tablet 2 times a day.  5.  Lantus 20 units subcutaneously once a day.  6.  Lisinopril 2.5 mg 1 tablet orally once a day.  7.  Magnesium hydroxide oral suspension as needed.  8.  Metformin 1000 mg tablet 1 tablet orally 2 times a day.  9.  Metoprolol succinate 25 mg oral tablet 1 tablet orally once a day.  10.  Xanax 1 mg tablet 0.5 tablet orally 2 times a day.    REVIEW OF SYSTEMS: CONSTITUTIONAL:  Negative for fever or chills. No fatigue. No generalized weakness.  EYES:  Negative for blurred vision or double vision. No pain. No redness. No discharge.  EARS, NOSE, AND THROAT:  Negative for tinnitus, ear pain, hearing loss, epistaxis, or nasal discharge.  RESPIRATORY:  Negative for cough, wheezing, dyspnea, hemoptysis, or painful respiration.  CARDIOVASCULAR:  Negative for chest pain, palpitations, dizziness, syncopal episodes, orthopnea, dyspnea on exertion, or pedal  edema.  GASTROINTESTINAL:  Positive for nausea, vomiting, and periumbilical abdominal pain as noted in the history of present illness. No hematemesis. No melena. No rectal bleeding.  GENITOURINARY:  Negative for dysuria. History of chronic frequency and urgency of urination present.  ENDOCRINE:  Negative for polyuria, nocturia, or heat or cold intolerance.  HEMATOLOGIC AND LYMPHATIC:  Negative for anemia, easy bruising or bleeding, or swollen glands.  INTEGUMENTARY:  Negative  for acne, skin rash, or lesions.  MUSCULOSKELETAL:  Negative for neck or back pain. No history of arthritis or gout. History of chronic tremors present.  NEUROLOGICAL:  Negative for focal weakness or numbness. History of chronic tremors present. No history of CVA or TIA.  PSYCHIATRIC:  Negative for anxiety, insomnia, or depression.   PHYSICAL EXAMINATION: VITAL SIGNS:  Temperature 97.9 degrees Fahrenheit, pulse rate 74 per minute, respirations 22 per minute, blood pressure 160/86, and oxygen saturation 94% on room air.  GENERAL:  Well developed, well nourished, alert, no acute distress, comfortably resting in the bed, mildly anxious.  HEAD:  Atraumatic, normocephalic.  EYES:  Pupils are equal and reactive to light and accommodation. No conjunctival pallor. No icterus. Extraocular movements are intact.  NOSE:  No drainage. No lesions.  EARS:  No drainage. No external lesions.  ORAL CAVITY:  No mucosal lesions. No exudates.  NECK:  Supple. No JVD. No thyromegaly. No carotid bruit. Range of motion of the neck is within normal limits.  RESPIRATORY:  Good respiratory effort. Not using accessory muscles of respiration. Bilateral vesicular breath sounds. No rales or rhonchi.  CARDIOVASCULAR:  S1 and S2, regular. No murmurs, gallops, or clicks. Pulses equal at carotid, femoral, and pedal pulses. No peripheral edema.  GASTROINTESTINAL:  Abdomen is soft with diffuse mild tenderness present. No guarding. No rigidity. Bowel sounds present and equal in all 4 quadrants.  GENITOURINARY:  Deferred.  MUSCULOSKELETAL:  No joint tenderness or effusion. Range of motion is adequate. Strength and tone are equal bilaterally.  SKIN:  Inspection within normal limits. No obvious sores.  LYMPHATIC:  No cervical lymphadenopathy.  VASCULAR:  Good dorsalis pedis and posterior tibial pulses.  NEUROLOGICAL:  Alert, awake, and oriented x 3. Cranial nerves II through XII are grossly intact. No sensory deficit. Motor strength is  5/5 in both lower extremities. DTRs are 2+ bilaterally and symmetrical. Plantars are downgoing. Chronic tremors present.  PSYCHIATRIC:  Alert, awake, and oriented x 3. Judgment and insight are adequate. Memory and mood are within normal limits.   ANCILLARY DATA:  Laboratory data:  Serum glucose is 213, BUN 20, creatinine 0.83, sodium 138, potassium 3.1, chloride 95, bicarbonate 32, total calcium 9.3, lipase 784, venous lactic acid 2.1, total protein 7.5, albumin 4.1, total bilirubin 0.3, alkaline phosphatase 71, AST 48, and ALT is 23. Troponin is less than 0.03. WBC is 17.8, hemoglobin 12.1, hematocrit 37.7, and platelet count is 297,000.   CT of the abdomen and the pelvis:  Infiltration around the head of the pancreas suggesting early changes of acute pancreatitis. No fluid collection. Nonspecific gallbladder distention. No bile duct dilatation.   EKG:  First EKG reported as atrial flutter with ventricular rate of 79 beats per minute, but on close inspection P waves noted. Repeat EKG was done showing sinus tachycardia with ventricular rate of 102 beats per minute and no acute ST-T changes.    ASSESSMENT AND PLAN:  This is an 81 year old Caucasian female with a history of hypertension, diabetes mellitus type 2, and chronic tremors, who presents with acute  onset of periumbilical pain with associated nausea and vomiting, found to have elevated lipase and a CT of the abdomen and the pelvis with findings suggestive of early pancreatitis.   1.  Acute pancreatitis, etiology not known at this time, liver function tests normal. Plan:  Admit to medical floor, keep her n.p.o. except medications, IV fluids, IV pain control medications, follow lipase, consult GI as needed.  2.  Diabetes mellitus type 2, history of hypoglycemia during prior hospital admissions. We will hold off on oral medications and Lantus at this time since the patient is n.p.o. We will place her on sliding scale insulin and monitor blood sugars  closely.  3.  Hypertension, stable on home medications. Continue same.  4.  Chronic tremors. No acute problems at this time. The patient is on p.r.n. Xanax. Continue same.  5.  Deep vein thrombosis prophylaxis. Subcutaneous Lovenox.  6.  Gastrointestinal prophylaxis. Proton pump inhibitor.   CODE STATUS:  Full code.   TIME SPENT:  50 minutes.    ____________________________ Juluis Mire, MD enr:nb D: 11/11/2014 02:36:25 ET T: 11/11/2014 03:05:59 ET JOB#: [id number]  cc: Juluis Mire, MD, <Dictator> Myles Rosenthal. Mendel Ryder, MD Juluis Mire MD ELECTRONICALLY SIGNED 11/13/2014 15:35

## 2015-01-06 DIAGNOSIS — Z8719 Personal history of other diseases of the digestive system: Secondary | ICD-10-CM | POA: Insufficient documentation

## 2015-01-06 DIAGNOSIS — R1032 Left lower quadrant pain: Secondary | ICD-10-CM | POA: Insufficient documentation

## 2015-01-06 DIAGNOSIS — R1011 Right upper quadrant pain: Secondary | ICD-10-CM | POA: Insufficient documentation

## 2015-01-06 DIAGNOSIS — K5900 Constipation, unspecified: Secondary | ICD-10-CM | POA: Insufficient documentation

## 2015-01-06 DIAGNOSIS — Z87898 Personal history of other specified conditions: Secondary | ICD-10-CM | POA: Insufficient documentation

## 2015-01-18 ENCOUNTER — Other Ambulatory Visit: Payer: Self-pay | Admitting: Nurse Practitioner

## 2015-01-18 DIAGNOSIS — R1011 Right upper quadrant pain: Secondary | ICD-10-CM

## 2015-04-29 ENCOUNTER — Telehealth: Payer: Self-pay | Admitting: Internal Medicine

## 2015-04-29 NOTE — Telephone Encounter (Signed)
Patient wanted to know if there is a GI that comes to the [facility] in [place].  She is advised that we don't she wants to keep the appt for Dr. Carlean Purl.  I have placed her on the cancellation list.  She wants to see Dr. Carlean Purl because her children see him

## 2015-07-06 ENCOUNTER — Ambulatory Visit (INDEPENDENT_AMBULATORY_CARE_PROVIDER_SITE_OTHER): Payer: Medicare Other | Admitting: Internal Medicine

## 2015-07-06 ENCOUNTER — Encounter: Payer: Self-pay | Admitting: Internal Medicine

## 2015-07-06 VITALS — BP 122/66 | HR 80 | Ht 62.0 in | Wt 143.0 lb

## 2015-07-06 DIAGNOSIS — K5900 Constipation, unspecified: Secondary | ICD-10-CM | POA: Diagnosis not present

## 2015-07-06 DIAGNOSIS — R1314 Dysphagia, pharyngoesophageal phase: Secondary | ICD-10-CM | POA: Diagnosis not present

## 2015-07-06 DIAGNOSIS — R1319 Other dysphagia: Secondary | ICD-10-CM

## 2015-07-06 DIAGNOSIS — R1310 Dysphagia, unspecified: Secondary | ICD-10-CM

## 2015-07-06 DIAGNOSIS — K5909 Other constipation: Secondary | ICD-10-CM

## 2015-07-06 DIAGNOSIS — K828 Other specified diseases of gallbladder: Secondary | ICD-10-CM | POA: Diagnosis not present

## 2015-07-06 NOTE — Patient Instructions (Signed)
  Dr Carlean Purl recommends that you complete a bowel purge (to clean out your bowels). Please do the following: Purchase a bottle of Miralax over the counter as well as a box of 5 mg dulcolax tablets. Take 4 dulcolax tablets. Wait 1 hour. You will then drink 6-8 capfuls of Miralax mixed in an adequate amount of water/juice/gatorade (you may choose which of these liquids to drink) over the next 2-3 hours. You should expect results within 1 to 6 hours after completing the bowel purge.   You have been scheduled for a Barium Esophogram at [facility] on 07/12/15 at 9:00AM. Please arrive 15 minutes prior to your appointment for registration. Make certain not to have anything to eat or drink 6 hours prior to your test. If you need to reschedule for any reason, please contact radiology at [phone] to do so. __________________________________________________________________ A barium swallow is an examination that concentrates on views of the esophagus. This tends to be a double contrast exam (barium and two liquids which, when combined, create a gas to distend the wall of the oesophagus) or single contrast (non-ionic iodine based). The study is usually tailored to your symptoms so a good history is essential. Attention is paid during the study to the form, structure and configuration of the esophagus, looking for functional disorders (such as aspiration, dysphagia, achalasia, motility and reflux) EXAMINATION You may be asked to change into a gown, depending on the type of swallow being performed. A radiologist and radiographer will perform the procedure. The radiologist will advise you of the type of contrast selected for your procedure and direct you during the exam. You will be asked to stand, sit or lie in several different positions and to hold a small amount of fluid in your mouth before being asked to swallow while the imaging is performed .In some instances you may be asked to swallow  barium coated marshmallows to assess the motility of a solid food bolus. The exam can be recorded as a digital or video fluoroscopy procedure. POST PROCEDURE It will take 1-2 days for the barium to pass through your system. To facilitate this, it is important, unless otherwise directed, to increase your fluids for the next 24-48hrs and to resume your normal diet.  This test typically takes about 30 minutes to perform. __________________________________________________________________________________   I appreciate the opportunity to care for you. Silvano Rusk, MD, [facility]

## 2015-07-06 NOTE — Progress Notes (Signed)
   Subjective:    Patient ID: Angela Savage, female    DOB: [birth year], 81 y.o.   MRN: [id number] Cc: constipation, swallowing problems HPI  This delightful elderly woman is known to me from previous visits I've also seen her family members. She is here with her youngest son today. Chronic constipation since hip Fx when he 17. She says she has tried linens S MiraLAX and other laxatives including multiple stool softeners and right now drinking coffee and eating sugar-free candy seems to produce a loose or watery bowel movement most days. She does not have solid stools. She was constipated prior to this from what I can tell.  She also had pancreatitis and was hospitalized at [facility] in the early part of 2016 at that point I think she was told she had a get dysfunctional gallbladder Intermittent solid >>>> liquid dysphagia x 2-3 yrs  Gaining weight back after illnesses   "They want me to have a colonoscopy and get gallbladder out what I don't want to"  Medications, allergies, past medical history, past surgical history, family history and social history are reviewed and updated in the EMR.  Review of Systems She has a diffuse body tremor and would like to see another neurologist All other ROS negative    Objective:   Physical Exam @BP  122/66 mmHg  Pulse 80  Ht 5\' 2"  (1.575 m)  Wt 143 lb (64.864 kg)  BMI 26.15 kg/m2@  General:  Elderly ww, NAD Eyes:  anicteric. ENT:   Mouth and posterior pharynx free of lesions.  Neck:   supple w/o thyromegaly or mass.  Lungs: Clear to auscultation bilaterally. Heart:  S1S2, no rubs, murmurs, gallops. Abdomen:  soft, non-tender, no hepatosplenomegaly, hernia, or mass and BS+.  Rectal:  June McMurry, CMA present  Resting tone is slightly decreased the perianal area is normal. There is formed stool a significant amount in the rectal area though there is not a clearcut impaction there is a large amount of soft and normal consistency formed  stool there.  Lymph:  no cervical or supraclavicular adenopathy. Extremities:   no edema, cyanosis or clubbing Skin   no rash. Neuro:  A&O x 3.diffuse body tremor Psych:  appropriate mood and  Affect.   Data Reviewed: Care everywhere current nodal clinic GI notes from 2016, 2008 colonoscopy demonstrating diverticulosis and internal hemorrhoids, Hospital records from March 2016 admission, she had acute pancreatitis she had a HIDA scan with low all bladder ejection fraction at the time. CT scan then showed infiltration around the head of the pancreas suggesting early changes of acute pancreatitis, no stones on ultrasound gallbladder ejection fraction mentioned above with 8% Hip fracture discharge summary July 2015    Assessment & Plan:  Esophageal dysphagia - Plan: DG Esophagus  Chronic constipation  Dysfunctional gallbladder   Ba swallow w/ tablet MiraLax purge I would avoid a cholecystectomy at this point in this lady. She could need some repeat cross-sectional imaging given that she had pancreatitis and she is elderly but admitting invasive procedures seems to make the most sense here.  I will follow-up with her after the barium swallow with tablet and MiraLAX purge are completed. I've explained the rationale for purging in this case at think she may be having somewhat of a higher impaction and overflow situation with her bowels.  I appreciate the opportunity to care for this patient. [id number], Sabino Gasser, MD

## 2015-07-11 ENCOUNTER — Encounter: Payer: Self-pay | Admitting: Internal Medicine

## 2015-07-12 ENCOUNTER — Telehealth: Payer: Self-pay | Admitting: Internal Medicine

## 2015-07-12 ENCOUNTER — Ambulatory Visit: Payer: Medicare Other | Attending: Internal Medicine

## 2015-07-12 NOTE — Telephone Encounter (Signed)
Patient had questions about prep for BS.  All questions answered.  She will call back for any additional questions or concerns

## 2015-10-20 ENCOUNTER — Encounter: Payer: Self-pay | Admitting: Family Medicine

## 2015-10-26 ENCOUNTER — Ambulatory Visit (INDEPENDENT_AMBULATORY_CARE_PROVIDER_SITE_OTHER): Payer: Medicare Other | Admitting: Urology

## 2015-10-26 ENCOUNTER — Encounter: Payer: Self-pay | Admitting: Urology

## 2015-10-26 VITALS — BP 130/81 | HR 97 | Ht 62.0 in | Wt 135.7 lb

## 2015-10-26 DIAGNOSIS — N390 Urinary tract infection, site not specified: Secondary | ICD-10-CM

## 2015-10-26 DIAGNOSIS — N952 Postmenopausal atrophic vaginitis: Secondary | ICD-10-CM | POA: Diagnosis not present

## 2015-10-26 DIAGNOSIS — R310 Gross hematuria: Secondary | ICD-10-CM

## 2015-10-26 LAB — BLADDER SCAN AMB NON-IMAGING: Scan Result: 0

## 2015-10-26 NOTE — Patient Instructions (Signed)
You are given a sample of vaginal estrogen cream and instructed to apply 0.5mg  (pea-sized amount)  just inside the vaginal introitus with a finger-tip every night for two weeks and then Monday, Wednesday and Friday nights.  I explained to the patient that vaginally administered estrogen, which causes only a slight increase in the blood estrogen levels, have fewer contraindications and adverse systemic effects that oral HT.

## 2015-10-26 NOTE — Progress Notes (Signed)
10/26/2015 3:23 PM   Angela Savage 1931-12-06 [id number]  Referring provider: Lorelee Market, MD [place], [place] [address]  Chief Complaint  Patient presents with  . Chronic UTI    referred by Margret Chance [facility] in [place]    HPI: Patient is an 82 year old Caucasian female who is referred to [place] by her primary care provider, Margret Chance, NP, for chronic urinary tract infections.    Patient states that she is had several urinary tract infections since she broke her hip in 2015.  I only have one documented infection from [facility] at this time.  She states that when she has infections she is experiencing dysuria, feeling like someone is stabbing her in the bladder and gross hematuria.  She does not experience fevers, chills, nausea or vomiting with her infections.  He states she has her urinary tract infections treated through [facility].    She does not have a prior history of nephrolithiasis.  She believes she may have been diagnosed with bladder cancer in her 75's.  She has not had any recent follow-up with a urologist.    Her baseline urinary symptoms consist of urgency. She denies any urinary leakage.  Her UA demonstrated 11-30 RBCs per high-power field.  PVR 0 mL.     PMH: Past Medical History  Diagnosis Date  . Breast cancer ([place])   . Endometrial cancer ([place])   . Umbilical hernia   . Diabetes mellitus   . Hypertension   . Anxiety and depression   . Diverticulitis   . Peptic ulcer   . Allergy   . Anemia   . GERD (gastroesophageal reflux disease)   . Hyperlipidemia   . Cataracts, bilateral     Surgical History: Past Surgical History  Procedure Laterality Date  . Breast lumpectomy  1997    right  . Umbilical hernia repair  2007  . Axillary lymph node dissection  1997    right, after lumpectomy  . Total abdominal hysterectomy w/ bilateral salpingoophorectomy  1955    malignant tumor  . Colonoscopy   2008    Internal hemorrhoids, Dr. Vira Agar  . Bladder operation      Tumor ?    Home Medications:    Medication List       This list is accurate as of: 10/26/15  3:23 PM.  Always use your most recent med list.               ALPRAZolam 1 MG tablet  Commonly known as:  XANAX     aspirin 81 MG tablet  Take 81 mg by mouth daily.     fluticasone 50 MCG/ACT nasal spray  Commonly known as:  FLONASE     glimepiride 4 MG tablet  Commonly known as:  AMARYL  Take 4 mg by mouth 2 (two) times daily. Reported on 10/26/2015     hydrochlorothiazide 25 MG tablet  Commonly known as:  HYDRODIURIL  Take 25 mg by mouth every morning.     KLOR-CON 10 10 MEQ tablet  Generic drug:  potassium chloride  20 mEq Twice daily.     LANTUS SOLOSTAR 100 UNIT/ML Solostar Pen  Generic drug:  Insulin Glargine     LORazepam 1 MG tablet  Commonly known as:  ATIVAN  Take 1 mg by mouth 2 (two) times daily. Reported on 10/26/2015     metFORMIN 1000 MG tablet  Commonly known as:  GLUCOPHAGE  Take 1,000 mg by  mouth 2 (two) times daily with a meal.     metoprolol succinate 25 MG 24 hr tablet  Commonly known as:  TOPROL-XL  Take 25 mg by mouth every morning.     primidone 50 MG tablet  Commonly known as:  MYSOLINE        Allergies:  Allergies  Allergen Reactions  . Avandia [Rosiglitazone Maleate] Other (See Comments)    Rapid heart rate  . Ciprofloxacin     Heart problems   . Glucovance [Glyburide-Metformin] Other (See Comments)    Rapid heart beat  . Rosiglitazone Other (See Comments) and Palpitations    Chest pain.    Family History: Family History  Problem Relation Age of Onset  . Breast cancer Daughter     sisters x3  . Ovarian cancer Sister   . Diabetes Father     children  . Stroke Mother   . Colon cancer Maternal Aunt   . Kidney disease Neg Hx   . Bladder Cancer Neg Hx     Social History:  reports that she has never smoked. She has never used smokeless tobacco. She  reports that she does not drink alcohol or use illicit drugs.  ROS: UROLOGY Frequent Urination?: No Hard to postpone urination?: Yes Burning/pain with urination?: No Get up at night to urinate?: No Leakage of urine?: No Urine stream starts and stops?: No Trouble starting stream?: No Do you have to strain to urinate?: No Blood in urine?: No Urinary tract infection?: No Sexually transmitted disease?: No Injury to kidneys or bladder?: Yes Painful intercourse?: No Weak stream?: No Currently pregnant?: No Vaginal bleeding?: No Last menstrual period?: n  Gastrointestinal Nausea?: No Vomiting?: No Indigestion/heartburn?: No Diarrhea?: No Constipation?: No  Constitutional Fever: No Night sweats?: No Weight loss?: No Fatigue?: Yes  Skin Skin rash/lesions?: No Itching?: No  Eyes Blurred vision?: No Double vision?: No  Ears/Nose/Throat Sore throat?: No Sinus problems?: No  Hematologic/Lymphatic Swollen glands?: No Easy bruising?: No  Cardiovascular Leg swelling?: No Chest pain?: No  Respiratory Cough?: Yes Shortness of breath?: Yes  Endocrine Excessive thirst?: No  Musculoskeletal Back pain?: Yes Joint pain?: Yes  Neurological Headaches?: No Dizziness?: No  Psychologic Depression?: No Anxiety?: Yes  Physical Exam: BP 130/81 mmHg  Pulse 97  Ht 5\' 2"  (1.575 m)  Wt 135 lb 11.2 oz (61.553 kg)  BMI 24.81 kg/m2  Constitutional: Well nourished. Alert and oriented, No acute distress. HEENT: [place] AT, moist mucus membranes. Trachea midline, no masses. Cardiovascular: No clubbing, cyanosis, or edema. Respiratory: Normal respiratory effort, no increased work of breathing. GI: Abdomen is soft, non tender, non distended, no abdominal masses. Liver and spleen not palpable.  No hernias appreciated.  Stool sample for occult testing is not indicated.   GU: No CVA tenderness.  No bladder fullness or masses.  Atrophic external genitalia, normal pubic hair  distribution, no lesions.  Normal urethral meatus, no lesions, no prolapse, no discharge.   No urethral masses, tenderness and/or tenderness. No bladder fullness, tenderness or masses. Normal vagina mucosa, good estrogen effect, no discharge, no lesions, good pelvic support, no cystocele. A rectocele noted.  Uterus and cervix are surgically absent.  Ovaries are surgically absent.   Anus and perineum are without rashes or lesions.    Skin: No rashes, bruises or suspicious lesions. Lymph: No cervical or inguinal adenopathy. Neurologic: Grossly intact, no focal deficits, moving all 4 extremities. Psychiatric: Normal mood and affect.  Laboratory Data: Lab Results  Component Value Date  WBC 10.2 03/21/2014   HGB 9.0* 03/22/2014   HCT 26.8* 03/21/2014   MCV 85 03/21/2014   PLT 250 03/21/2014   Lab Results  Component Value Date   CREATININE 0.88 03/21/2014   Lab Results  Component Value Date   AST 27 03/19/2014   Lab Results  Component Value Date   ALT 21 03/19/2014     Urinalysis Results for orders placed or performed in visit on 10/26/15  Microscopic Examination  Result Value Ref Range   WBC, UA >30 (A) 0 -  5 /hpf   RBC, UA 11-30 (A) 0 -  2 /hpf   Epithelial Cells (non renal) >10 (A) 0 - 10 /hpf   Renal Epithel, UA 0-10 (A) None seen /hpf   Bacteria, UA Few None seen/Few  Urinalysis, Complete  Result Value Ref Range   Specific Gravity, UA >1.030 (H) 1.005 - 1.030   pH, UA 5.0 5.0 - 7.5   Color, UA Yellow Yellow   Appearance Ur Cloudy (A) Clear   Leukocytes, UA 1+ (A) Negative   Protein, UA 1+ (A) Negative/Trace   Glucose, UA Negative Negative   Ketones, UA Trace (A) Negative   RBC, UA 2+ (A) Negative   Bilirubin, UA Negative Negative   Urobilinogen, Ur 0.2 0.2 - 1.0 mg/dL   Nitrite, UA Negative Negative   Microscopic Examination See below:      Pertinent Imaging: Results for SHA, BOURDON (MRN [id number]) as of 10/26/2015 15:26  Ref. Range 10/26/2015 14:58    Scan Result Unknown 0    Assessment & Plan:    1. Gross hematuria:   Explained to patient the causes of blood in the urine are as follows: stones, UTI's, damage to the urinary tract and/or cancer.  It is explained to the patient that they will be scheduled for a CT Urogram with contrast material and that in rare instances, an allergic reaction can be serious and even life threatening with the injection of contrast material.   The patient denies any allergies to contrast, iodine and/or seafood and is taking metformin.  I have explained to the patient that they will  be scheduled for a cystoscopy in our office to evaluate their bladder.  The cystoscopy consists of passing a tube with a lens up through their urethra and into their urinary bladder.   We will inject the urethra with a lidocaine gel prior to introducing the cystoscope to help with any discomfort during the procedure.   After the procedure, they might experience blood in the urine and discomfort with urination.  This will abate after the first few voids.  I have  encouraged the patient to increase water intake  during this time.  Patient denies any allergies to lidocaine.   - Urinalysis, Complete - CULTURE, URINE COMPREHENSIVE  2. Chronic UTI:    I have one documented urinary tract infection from [facility] at this time. We will contact her PCP to obtain her records.  She will the undergoing a CT urogram and cystoscopy in the future for further evaluation of hematuria.  An etiology may be found for her symptoms or recurrent UTI's.    - BLADDER SCAN AMB NON-IMAGING  3. Atrophic vaginitis:   Patient was given a sample of vaginal estrogen cream and instructed to apply 0.5mg  (pea-sized amount)  just inside the vaginal introitus with a finger-tip every night for two weeks and then Monday, Wednesday and Friday nights.  I explained to the patient that vaginally administered  estrogen, which causes only a slight increase in the blood  estrogen levels, have fewer contraindications and adverse systemic effects that oral HT.  We will reexamine the vaginal area when she completes her hematuria work up.   4. ? History of bladder cancer:   Patient stated that she had a tumor removed from her bladder in her 51's. She believes it was cancerous.  However, she has not seen an urologist for several years and it would be unusual for an individual at that age to have bladder cancer.  She will be undergoing a CT urogram and cystoscopy for her gross hematuria.  Return for CT Urogram report and cystoscopy.  These notes generated with voice recognition software. I apologize for typographical errors.  Zara Council, [facility] [address] [place], [place] [address] [phone]

## 2015-10-27 DIAGNOSIS — R310 Gross hematuria: Secondary | ICD-10-CM | POA: Insufficient documentation

## 2015-10-27 DIAGNOSIS — N952 Postmenopausal atrophic vaginitis: Secondary | ICD-10-CM | POA: Insufficient documentation

## 2015-10-27 DIAGNOSIS — N390 Urinary tract infection, site not specified: Secondary | ICD-10-CM | POA: Insufficient documentation

## 2015-10-27 LAB — URINALYSIS, COMPLETE
BILIRUBIN UA: NEGATIVE
Glucose, UA: NEGATIVE
NITRITE UA: NEGATIVE
PH UA: 5 (ref 5.0–7.5)
Urobilinogen, Ur: 0.2 mg/dL (ref 0.2–1.0)

## 2015-10-27 LAB — MICROSCOPIC EXAMINATION: WBC, UA: >30 /hpf — AB (ref 0–?)

## 2015-10-27 LAB — BUN+CREAT
BUN / CREAT RATIO: 11 (ref 11–26)
BUN: 12 mg/dL (ref 8–27)
Creatinine, Ser: 1.06 mg/dL — ABNORMAL HIGH (ref 0.57–1.00)
GFR calc non Af Amer: 49 mL/min/{1.73_m2} — ABNORMAL LOW (ref >59)
GFR, EST AFRICAN AMERICAN: 57 mL/min/{1.73_m2} — AB (ref >59)

## 2015-10-28 LAB — CULTURE, URINE COMPREHENSIVE

## 2015-11-05 ENCOUNTER — Ambulatory Visit
Admission: RE | Admit: 2015-11-05 | Discharge: 2015-11-05 | Disposition: A | Discharge status: Home / Self Care | Payer: Medicare Other | Source: Ambulatory Visit | Attending: Urology | Admitting: Urology

## 2015-11-05 DIAGNOSIS — R310 Gross hematuria: Secondary | ICD-10-CM | POA: Diagnosis not present

## 2015-11-05 DIAGNOSIS — N281 Cyst of kidney, acquired: Secondary | ICD-10-CM | POA: Diagnosis not present

## 2015-11-05 MED ORDER — IOHEXOL 300 MG/ML  SOLN
125.0000 mL | Freq: Once | INTRAMUSCULAR | Status: AC | PRN
  Administered 2015-11-05: 125 mL via INTRAVENOUS

## 2015-11-11 ENCOUNTER — Ambulatory Visit (INDEPENDENT_AMBULATORY_CARE_PROVIDER_SITE_OTHER): Payer: Medicare Other | Admitting: Urology

## 2015-11-11 ENCOUNTER — Encounter: Payer: Self-pay | Admitting: Urology

## 2015-11-11 VITALS — BP 114/68 | HR 91 | Ht 60.0 in | Wt 136.8 lb

## 2015-11-11 DIAGNOSIS — N390 Urinary tract infection, site not specified: Secondary | ICD-10-CM

## 2015-11-11 DIAGNOSIS — N952 Postmenopausal atrophic vaginitis: Secondary | ICD-10-CM | POA: Diagnosis not present

## 2015-11-11 DIAGNOSIS — R310 Gross hematuria: Secondary | ICD-10-CM | POA: Diagnosis not present

## 2015-11-11 LAB — URINALYSIS, COMPLETE
Bilirubin, UA: NEGATIVE
Leukocytes, UA: NEGATIVE
NITRITE UA: NEGATIVE
Protein, UA: NEGATIVE
Specific Gravity, UA: 1.02 (ref 1.005–1.030)
Urobilinogen, Ur: 1 mg/dL (ref 0.2–1.0)
pH, UA: 5.5 (ref 5.0–7.5)

## 2015-11-11 LAB — MICROSCOPIC EXAMINATION
BACTERIA UA: NONE SEEN
Epithelial Cells (non renal): >10 /hpf — AB (ref 0–10)

## 2015-11-11 MED ORDER — CEPHALEXIN 500 MG PO CAPS: 500.0000 mg | ORAL_CAPSULE | Freq: Every day | ORAL | Status: DC

## 2015-11-11 MED ORDER — ESTRADIOL 0.1 MG/GM VA CREA: 1.0000 | TOPICAL_CREAM | VAGINAL | Status: DC

## 2015-11-11 MED ORDER — CEPHALEXIN 500 MG PO CAPS
500.0000 mg | ORAL_CAPSULE | Freq: Once | ORAL | Status: AC
  Administered 2015-11-11: 500 mg via ORAL

## 2015-11-11 MED ORDER — LIDOCAINE HCL 2 % EX GEL
1.0000 "application " | Freq: Once | CUTANEOUS | Status: AC
  Administered 2015-11-11: 1 via URETHRAL

## 2015-11-11 NOTE — Progress Notes (Signed)
11/11/2015 2:40 PM   Angela Savage 16-Oct-1931 [id number]  Referring provider: Lorelee Market, MD [place], [place] [address]  Chief Complaint  Patient presents with  . Cysto    hematuria    HPI: Patient is an 82 year old Caucasian female who is referred to [place] by her primary care provider, Margret Chance, NP, for chronic urinary tract infections.   Patient states that she is had several urinary tract infections since she broke her hip in 2015. I only have one documented infection from [facility] at this time. She states that when she has infections she is experiencing dysuria, feeling like someone is stabbing her in the bladder and gross hematuria. She does not experience fevers, chills, nausea or vomiting with her infections. He states she has her urinary tract infections treated through [facility].   She does not have a prior history of nephrolithiasis. She believes she may have been diagnosed with bladder cancer in her 71's. She has not had any recent follow-up with a urologist.   Her baseline urinary symptoms consist of urgency. She denies any urinary leakage. Her UA demonstrated 11-30 RBCs per high-power field. PVR 0 mL.   March 2017 interval history:  The patient follows up for completion of her hematuria workup. She had a negative CT urogram. She had negative cystoscopy today  PMH: Past Medical History  Diagnosis Date  . Breast cancer ([place])   . Endometrial cancer ([place])   . Umbilical hernia   . Diabetes mellitus   . Hypertension   . Anxiety and depression   . Diverticulitis   . Peptic ulcer   . Allergy   . Anemia   . GERD (gastroesophageal reflux disease)   . Hyperlipidemia   . Cataracts, bilateral     Surgical History: Past Surgical History  Procedure Laterality Date  . Breast lumpectomy  1997    right  . Umbilical hernia repair  2007  . Axillary lymph node dissection  1997    right, after lumpectomy    . Total abdominal hysterectomy w/ bilateral salpingoophorectomy  1955    malignant tumor  . Colonoscopy  2008    Internal hemorrhoids, Dr. Vira Agar  . Bladder operation      Tumor ?    Home Medications:    Medication List       This list is accurate as of: 11/11/15  2:40 PM.  Always use your most recent med list.               ALPRAZolam 1 MG tablet  Commonly known as:  XANAX     aspirin 81 MG tablet  Take 81 mg by mouth daily.     fluticasone 50 MCG/ACT nasal spray  Commonly known as:  FLONASE     glimepiride 4 MG tablet  Commonly known as:  AMARYL  Take 4 mg by mouth 2 (two) times daily. Reported on 10/26/2015     hydrochlorothiazide 25 MG tablet  Commonly known as:  HYDRODIURIL  Take 25 mg by mouth every morning.     KLOR-CON 10 10 MEQ tablet  Generic drug:  potassium chloride  20 mEq Twice daily.     LANTUS SOLOSTAR 100 UNIT/ML Solostar Pen  Generic drug:  Insulin Glargine     LORazepam 1 MG tablet  Commonly known as:  ATIVAN  Take 1 mg by mouth 2 (two) times daily. Reported on 11/11/2015     metFORMIN 1000 MG tablet  Commonly known as:  GLUCOPHAGE  Take 1,000 mg by mouth 2 (two) times daily with a meal.     metoprolol succinate 50 MG 24 hr tablet  Commonly known as:  TOPROL-XL     primidone 50 MG tablet  Commonly known as:  MYSOLINE        Allergies:  Allergies  Allergen Reactions  . Avandia [Rosiglitazone Maleate] Other (See Comments)    Rapid heart rate  . Ciprofloxacin     Heart problems   . Glucovance [Glyburide-Metformin] Other (See Comments)    Rapid heart beat  . Rosiglitazone Other (See Comments) and Palpitations    Chest pain.    Family History: Family History  Problem Relation Age of Onset  . Breast cancer Daughter     sisters x3  . Ovarian cancer Sister   . Diabetes Father     children  . Stroke Mother   . Colon cancer Maternal Aunt   . Kidney disease Neg Hx   . Bladder Cancer Neg Hx     Social History:  reports that  she has never smoked. She has never used smokeless tobacco. She reports that she does not drink alcohol or use illicit drugs.  ROS:                                        Physical Exam: There were no vitals taken for this visit.  Constitutional:  Alert and oriented, No acute distress. HEENT: [place] AT, moist mucus membranes.  Trachea midline, no masses. Cardiovascular: No clubbing, cyanosis, or edema. Respiratory: Normal respiratory effort, no increased work of breathing. GI: Abdomen is soft, nontender, nondistended, no abdominal masses GU: No CVA tenderness.  Skin: No rashes, bruises or suspicious lesions. Lymph: No cervical or inguinal adenopathy. Neurologic: Grossly intact, no focal deficits, moving all 4 extremities. Psychiatric: Normal mood and affect.  Laboratory Data: Lab Results  Component Value Date   WBC 10.2 03/21/2014   HGB 9.0* 03/22/2014   HCT 26.8* 03/21/2014   MCV 85 03/21/2014   PLT 250 03/21/2014    Lab Results  Component Value Date   CREATININE 1.06* 10/26/2015    No results found for: PSA  No results found for: TESTOSTERONE  No results found for: HGBA1C  Urinalysis    Component Value Date/Time   COLORURINE Yellow 03/20/2014 0944   APPEARANCEUR Clear 03/20/2014 0944   LABSPEC 1.013 03/20/2014 0944   PHURINE 5.0 03/20/2014 0944   GLUCOSEU Negative 10/26/2015 1443   GLUCOSEU Negative 03/20/2014 0944   HGBUR 2+ 03/20/2014 0944   BILIRUBINUR Negative 10/26/2015 1443   BILIRUBINUR Negative 03/20/2014 0944   KETONESUR Trace 03/20/2014 0944   PROTEINUR Negative 03/20/2014 0944   NITRITE Negative 10/26/2015 1443   NITRITE Negative 03/20/2014 0944   LEUKOCYTESUR 1+* 10/26/2015 1443   LEUKOCYTESUR 1+ 03/20/2014 0944    Pertinent Imaging: CLINICAL DATA: Patient with intermittent lower abdominal pain for 2 years. Microscopic hematuria.  EXAM: CT ABDOMEN AND PELVIS WITHOUT AND WITH CONTRAST  TECHNIQUE: Multidetector CT  imaging of the abdomen and pelvis was performed following the standard protocol before and following the bolus administration of intravenous contrast.  CONTRAST: 147mL OMNIPAQUE IOHEXOL 300 MG/ML SOLN  COMPARISON: CT abdomen pelvis 11/10/2014.  FINDINGS: Lower chest: Normal heart size. Lung bases are clear. No pleural effusion.  Hepatobiliary: Liver is normal in size and contour. No focal lesion identified. Gallbladder is unremarkable.  Pancreas: Unremarkable  Spleen: Unremarkable  Adrenals/Urinary Tract: There is a 9 mm left adrenal nodule within internal density of -4 Hounsfield units, compatible with an adenoma. Right adrenal gland is normal. Unchanged 5.5 cm cyst off the superior pole of the right kidney. Unchanged 1.7 cm cyst off the interpolar region of the right kidney. Multiple sub cm low-attenuation lesions throughout left kidney too small to characterize. Delayed phase images demonstrate excretion of contrast material into the bilateral renal collecting systems and ureters. The opacified portions are unremarkable without abnormal filling defects identified.  Stomach/Bowel: Sigmoid and descending colonic diverticulosis. No CT evidence for acute diverticulitis. Normal morphology to the stomach. No free fluid or free intraperitoneal air.  Vascular/Lymphatic: Normal caliber abdominal aorta. No retroperitoneal lymphadenopathy.  Other: Fat containing bilateral inguinal hernias.  Musculoskeletal: Postsurgical hardware proximal left femur. Osseous demineralization. Lumbar spine degenerative changes. No aggressive or acute appearing osseous lesions.  IMPRESSION: No acute process within the abdomen or pelvis.  Multiple renal cysts. No causative etiology identified for reported hematuria.    Cystoscopy Procedure Note  Patient identification was confirmed, informed consent was obtained, and patient was prepped using Betadine solution.  Lidocaine  jelly was administered per urethral meatus.    Preoperative abx where received prior to procedure.    Procedure: - Flexible cystoscope introduced, without any difficulty.   - Thorough search of the bladder revealed:    normal urethral meatus    normal urothelium    no stones    no ulcers     no tumors    no urethral polyps    no trabeculation  - Ureteral orifices were normal in position and appearance.  Post-Procedure: - Patient tolerated the procedure well   Assessment & Plan:    1. Gross hematuria: Negative work up  2. Chronic UTI:  Patient to contact office if develops symptoms of a UTI for a urine culture  3. Atrophic vaginitis:  Continue vaginal estrogen cream 3 x weekly  4. Benign renal cysts No further work up needed  Return in about 3 months (around 02/11/2016) for follow up with [facility].  Nickie Retort, MD  [facility] [address] [place], [place] [address] [phone]

## 2015-12-06 DIAGNOSIS — E119 Type 2 diabetes mellitus without complications: Secondary | ICD-10-CM | POA: Insufficient documentation

## 2015-12-06 DIAGNOSIS — I10 Essential (primary) hypertension: Secondary | ICD-10-CM

## 2015-12-06 DIAGNOSIS — Z8781 Personal history of (healed) traumatic fracture: Secondary | ICD-10-CM | POA: Insufficient documentation

## 2016-02-10 ENCOUNTER — Ambulatory Visit: Payer: Medicare Other | Admitting: Urology

## 2016-10-05 ENCOUNTER — Other Ambulatory Visit: Payer: Self-pay | Admitting: Nurse Practitioner

## 2016-10-05 DIAGNOSIS — G20 Parkinson's disease: Secondary | ICD-10-CM

## 2016-10-17 ENCOUNTER — Ambulatory Visit: Payer: Medicare Other

## 2016-10-17 ENCOUNTER — Ambulatory Visit
Admission: RE | Admit: 2016-10-17 | Discharge: 2016-10-17 | Disposition: A | Discharge status: Home / Self Care | Payer: Medicare Other | Source: Ambulatory Visit | Attending: Nurse Practitioner | Admitting: Nurse Practitioner

## 2016-10-17 DIAGNOSIS — G20A1 Parkinson's disease without dyskinesia, without mention of fluctuations: Secondary | ICD-10-CM

## 2016-10-17 DIAGNOSIS — G20 Parkinson's disease: Secondary | ICD-10-CM | POA: Diagnosis not present

## 2017-04-18 ENCOUNTER — Encounter: Payer: Self-pay | Admitting: Gastroenterology

## 2017-04-24 ENCOUNTER — Ambulatory Visit: Payer: Medicare Other | Admitting: Internal Medicine

## 2017-08-29 ENCOUNTER — Ambulatory Visit: Payer: Medicare Other | Admitting: Internal Medicine

## 2017-09-21 DIAGNOSIS — E876 Hypokalemia: Secondary | ICD-10-CM | POA: Insufficient documentation

## 2017-09-21 DIAGNOSIS — R7309 Other abnormal glucose: Secondary | ICD-10-CM | POA: Insufficient documentation

## 2017-09-21 DIAGNOSIS — I10 Essential (primary) hypertension: Secondary | ICD-10-CM | POA: Insufficient documentation

## 2017-12-10 DIAGNOSIS — Z853 Personal history of malignant neoplasm of breast: Secondary | ICD-10-CM | POA: Insufficient documentation

## 2017-12-20 ENCOUNTER — Ambulatory Visit: Payer: Medicare Other | Admitting: Urology

## 2018-01-01 ENCOUNTER — Other Ambulatory Visit: Payer: Self-pay | Admitting: Neurology

## 2018-01-01 DIAGNOSIS — G20 Parkinson's disease: Secondary | ICD-10-CM

## 2018-01-04 ENCOUNTER — Ambulatory Visit: Payer: Medicare Other | Attending: Neurology

## 2018-01-15 ENCOUNTER — Other Ambulatory Visit: Payer: Self-pay

## 2018-01-15 ENCOUNTER — Encounter: Payer: Self-pay | Admitting: Urology

## 2018-01-15 ENCOUNTER — Ambulatory Visit: Payer: Medicare Other | Admitting: Urology

## 2018-01-15 VITALS — BP 117/78 | HR 120 | Wt 123.8 lb

## 2018-01-15 DIAGNOSIS — N390 Urinary tract infection, site not specified: Secondary | ICD-10-CM | POA: Diagnosis not present

## 2018-01-15 DIAGNOSIS — M179 Osteoarthritis of knee, unspecified: Secondary | ICD-10-CM | POA: Insufficient documentation

## 2018-01-15 DIAGNOSIS — M7550 Bursitis of unspecified shoulder: Secondary | ICD-10-CM

## 2018-01-15 DIAGNOSIS — N3941 Urge incontinence: Secondary | ICD-10-CM

## 2018-01-15 DIAGNOSIS — M719 Bursopathy, unspecified: Secondary | ICD-10-CM | POA: Insufficient documentation

## 2018-01-15 DIAGNOSIS — S72143A Displaced intertrochanteric fracture of unspecified femur, initial encounter for closed fracture: Secondary | ICD-10-CM | POA: Insufficient documentation

## 2018-01-15 DIAGNOSIS — M1710 Unilateral primary osteoarthritis, unspecified knee: Secondary | ICD-10-CM | POA: Insufficient documentation

## 2018-01-15 LAB — MICROSCOPIC EXAMINATION: WBC, UA: NONE SEEN /hpf (ref 0–5)

## 2018-01-15 LAB — URINALYSIS, COMPLETE
Bilirubin, UA: POSITIVE — AB
Glucose, UA: NEGATIVE
LEUKOCYTES UA: NEGATIVE
Nitrite, UA: POSITIVE — AB
PH UA: 5 (ref 5.0–7.5)
Specific Gravity, UA: 1.025 (ref 1.005–1.030)
Urobilinogen, Ur: 1 mg/dL (ref 0.2–1.0)

## 2018-01-15 NOTE — Progress Notes (Signed)
01/15/2018 2:39 PM   Angela Savage 01-Jun-1932 [id number]  Referring provider: Lorelee Market, [place], [place] [address]  Chief Complaint  Patient presents with  . Urinary Tract Infection    HPI: 84 year old female who saw Dr. Pilar Jarvis in March 2018 for microhematuria.  She had unremarkable cystoscopy.  CT urogram showed renal cysts.  She presents today complaining of urinary incontinence x4 days.  She complains of dribbling urine and has low left back pain.  She denies fever, chills or gross hematuria.  She was unable to give a specimen today and was catheterized for a minimal amount which was not enough to even run a culture.  Her urinalysis was nitrite positive but leukocyte negative.  Micro showed 3-10 RBC.   PMH: Past Medical History:  Diagnosis Date  . Allergy   . Anemia   . Anxiety and depression   . Breast cancer ([place])   . Cataracts, bilateral   . Diabetes mellitus   . Diverticulitis   . Endometrial cancer ([place])   . GERD (gastroesophageal reflux disease)   . Hyperlipidemia   . Hypertension   . Peptic ulcer   . Umbilical hernia     Surgical History: Past Surgical History:  Procedure Laterality Date  . [place]   right, after lumpectomy  . Bladder operation     Tumor ?  Marland Kitchen BREAST LUMPECTOMY  1997   right  . COLONOSCOPY  2008   Internal hemorrhoids, Dr. Vira Agar  . TOTAL ABDOMINAL HYSTERECTOMY W/ BILATERAL SALPINGOOPHORECTOMY  1955   malignant tumor  . UMBILICAL HERNIA REPAIR  2007    Home Medications:  Allergies as of 01/15/2018      Reactions   Avandia [rosiglitazone Maleate] Other (See Comments)   Rapid heart rate   Ciprofloxacin    Heart problems   Codeine    Glucovance [glyburide-metformin] Other (See Comments)   Rapid heart beat   Rosiglitazone Other (See Comments), Palpitations   Chest pain.      Medication List        Accurate as of 01/15/18  2:39 PM. Always use your most recent med list.          acetaminophen 325 MG tablet Commonly known as:  TYLENOL acetaminophen   ALPRAZolam 1 MG tablet Commonly known as:  XANAX   aspirin 325 MG tablet aspirin 325 mg tablet  Take 1 tablet every day by oral route.   aspirin 81 MG tablet Take 81 mg by mouth daily.   CALCIUM 500 + D 500-125 MG-UNIT Tabs Generic drug:  Calcium Carbonate-Vitamin D Calcium 500 + D   carbidopa-levodopa 25-100 MG disintegrating tablet Commonly known as:  PARCOPA Take by mouth.   estradiol 0.1 MG/GM vaginal cream Commonly known as:  ESTRACE Place vaginally.   ferrous sulfate 325 (65 FE) MG EC tablet ferrous sulfate 325 mg (65 mg iron) tablet,delayed release  Take by oral route.   fluticasone 50 MCG/ACT nasal spray Commonly known as:  FLONASE   furosemide 20 MG tablet Commonly known as:  LASIX furosemide 20 mg tablet   glimepiride 4 MG tablet Commonly known as:  AMARYL Take 4 mg by mouth 2 (two) times daily. Reported on 10/26/2015   glucose blood test strip Accu-Chek Compact Test strips   hydrochlorothiazide 25 MG tablet Commonly known as:  HYDRODIURIL Take 25 mg by mouth every morning.   HYDROcodone-acetaminophen 5-325 MG tablet Commonly known as:  NORCO/VICODIN hydrocodone 5 mg-acetaminophen 325 mg tablet  POTASSIUM CHLORIDE PO potassium chloride ER 10 mEq tablet,extended release   KLOR-CON 10 10 MEQ tablet Generic drug:  potassium chloride 20 mEq Twice daily.   LANTUS SOLOSTAR 100 UNIT/ML Solostar Pen Generic drug:  Insulin Glargine   lisinopril 2.5 MG tablet Commonly known as:  PRINIVIL,ZESTRIL lisinopril 2.5 mg tablet   LORazepam 1 MG tablet Commonly known as:  ATIVAN Take 1 mg by mouth 2 (two) times daily. Reported on 11/11/2015   magnesium hydroxide 400 MG/5ML suspension Commonly known as:  MILK OF MAGNESIA Milk of Magnesia 400 mg/5 mL oral suspension  Take 30 mL every day by oral route.   metFORMIN 1000 MG tablet Commonly known as:  GLUCOPHAGE Take 1,000  mg by mouth 2 (two) times daily with a meal.   metoprolol succinate 50 MG 24 hr tablet Commonly known as:  TOPROL-XL   metroNIDAZOLE 500 MG tablet Commonly known as:  FLAGYL metronidazole 500 mg tablet   nitrofurantoin 100 MG capsule Commonly known as:  MACRODANTIN nitrofurantoin monohydrate/macrocrystals 100 mg capsule   polyethylene glycol packet Commonly known as:  MIRALAX / GLYCOLAX Miralax 17 gram/dose oral powder  Take by oral route.   potassium chloride SA 20 MEQ tablet Commonly known as:  K-DUR,KLOR-CON potassium chloride ER 20 mEq tablet,extended release(part/cryst)   Pramipexole Dihydrochloride 1.5 MG Tb24 Take by mouth. Start taking on:  01/30/2018   primidone 50 MG tablet Commonly known as:  MYSOLINE   SENNA-PLUS 8.6-50 MG tablet Generic drug:  senna-docusate Senna Plus 8.6 mg-50 mg tablet  Take 2 tablets every day by oral route.   sulfamethoxazole-trimethoprim 800-160 MG tablet Commonly known as:  BACTRIM DS,SEPTRA DS sulfamethoxazole 800 mg-trimethoprim 160 mg tablet   traMADol-acetaminophen 37.5-325 MG tablet Commonly known as:  ULTRACET tramadol 37.5 mg-acetaminophen 325 mg tablet   valACYclovir 1000 MG tablet Commonly known as:  VALTREX valacyclovir 1 gram tablet   Vitamin D3 1000 units Caps Vitamin D3 1,000 unit capsule  Take by oral route.       Allergies:  Allergies  Allergen Reactions  . Avandia [Rosiglitazone Maleate] Other (See Comments)    Rapid heart rate  . Ciprofloxacin     Heart problems   . Codeine   . Glucovance [Glyburide-Metformin] Other (See Comments)    Rapid heart beat  . Rosiglitazone Other (See Comments) and Palpitations    Chest pain.    Family History: Family History  Problem Relation Age of Onset  . Colon cancer Maternal Aunt   . Breast cancer Daughter        sisters x3  . Ovarian cancer Sister   . Diabetes Father        children  . Stroke Mother   . Kidney disease Neg Hx   . Bladder Cancer Neg Hx       Social History:  reports that she has never smoked. She has never used smokeless tobacco. She reports that she does not drink alcohol or use drugs.  ROS: UROLOGY Frequent Urination?: Yes Hard to postpone urination?: No Burning/pain with urination?: No Get up at night to urinate?: Yes Leakage of urine?: Yes Urine stream starts and stops?: No Trouble starting stream?: No Do you have to strain to urinate?: No Blood in urine?: No Urinary tract infection?: No Sexually transmitted disease?: No Injury to kidneys or bladder?: Yes Painful intercourse?: No Weak stream?: No Currently pregnant?: No Vaginal bleeding?: No Last menstrual period?: N/A  Gastrointestinal Nausea?: No Vomiting?: No Indigestion/heartburn?: No Diarrhea?: No Constipation?: No  Constitutional Fever: No  Night sweats?: No Weight loss?: Yes Fatigue?: Yes  Skin Skin rash/lesions?: No Itching?: No  Eyes Blurred vision?: Yes Double vision?: No  Ears/Nose/Throat Sore throat?: No Sinus problems?: No  Hematologic/Lymphatic Swollen glands?: No Easy bruising?: No  Cardiovascular Leg swelling?: No Chest pain?: No  Respiratory Cough?: No Shortness of breath?: No  Endocrine Excessive thirst?: No  Musculoskeletal Back pain?: Yes Joint pain?: No  Neurological Headaches?: No Dizziness?: No  Psychologic Depression?: No Anxiety?: No  Physical Exam: BP 117/78 (BP Location: Right Arm, Patient Position: Sitting, Cuff Size: Normal)   Pulse (!) 120   Wt 123 lb 12.8 oz (56.2 kg)   SpO2 99%   BMI 24.18 kg/m   Constitutional:  Alert and oriented, No acute distress. HEENT: [place] AT, moist mucus membranes.  Trachea midline, no masses. Cardiovascular: No clubbing, cyanosis, or edema. Respiratory: Normal respiratory effort, no increased work of breathing. GI: Abdomen is soft, nontender, nondistended, no abdominal masses GU: No CVA tenderness Lymph: No cervical or inguinal lymphadenopathy. Skin: No  rashes, bruises or suspicious lesions. Neurologic: Grossly intact, no focal deficits, moving all 4 extremities. Psychiatric: Normal mood and affect.  Laboratory Data: Lab Results  Component Value Date   WBC 10.2 03/21/2014   HGB 9.0 (L) 03/22/2014   HCT 26.8 (L) 03/21/2014   MCV 85 03/21/2014   PLT 250 03/21/2014    Lab Results  Component Value Date   CREATININE 1.06 (H) 10/26/2015    Urinalysis 3-10 rbc   Assessment & Plan:   She was catheterized for an additional urine specimen for culture.  Will await culture results prior to antibiotic therapy.  She was given samples of Myrbetriq 25 mg for her irritative symptoms pending the culture result.    Abbie Sons, [place] [address] [place], [place] [address] [phone]

## 2018-01-16 ENCOUNTER — Other Ambulatory Visit: Payer: Self-pay

## 2018-01-16 DIAGNOSIS — N390 Urinary tract infection, site not specified: Secondary | ICD-10-CM

## 2018-01-16 LAB — URINALYSIS, COMPLETE
Bilirubin, UA: NEGATIVE
GLUCOSE, UA: NEGATIVE
Ketones, UA: NEGATIVE
LEUKOCYTES UA: NEGATIVE
Nitrite, UA: NEGATIVE
PH UA: 5.5 (ref 5.0–7.5)
PROTEIN UA: NEGATIVE
Specific Gravity, UA: 1.015 (ref 1.005–1.030)
Urobilinogen, Ur: 0.2 mg/dL (ref 0.2–1.0)

## 2018-01-16 LAB — MICROSCOPIC EXAMINATION

## 2018-01-17 ENCOUNTER — Encounter: Payer: Self-pay | Admitting: Urology

## 2018-01-17 LAB — CULTURE, URINE COMPREHENSIVE

## 2018-01-18 ENCOUNTER — Telehealth: Payer: Self-pay | Admitting: Family Medicine

## 2018-01-18 NOTE — Telephone Encounter (Signed)
Patient notified

## 2018-01-18 NOTE — Telephone Encounter (Signed)
-----   Message from Abbie Sons, MD sent at 01/18/2018  8:16 AM EDT ----- Urine culture showed no evidence of infection

## 2018-01-19 LAB — CULTURE, URINE COMPREHENSIVE

## 2018-06-07 ENCOUNTER — Other Ambulatory Visit: Payer: Self-pay | Admitting: Family Medicine

## 2018-06-07 DIAGNOSIS — N6312 Unspecified lump in the right breast, upper inner quadrant: Secondary | ICD-10-CM

## 2018-06-11 ENCOUNTER — Encounter: Payer: Self-pay | Admitting: Internal Medicine

## 2018-06-11 ENCOUNTER — Ambulatory Visit: Payer: Medicare Other | Admitting: Internal Medicine

## 2018-06-11 VITALS — BP 134/80 | HR 92

## 2018-06-11 DIAGNOSIS — R1310 Dysphagia, unspecified: Secondary | ICD-10-CM

## 2018-06-11 DIAGNOSIS — R1013 Epigastric pain: Secondary | ICD-10-CM | POA: Diagnosis not present

## 2018-06-11 DIAGNOSIS — R634 Abnormal weight loss: Secondary | ICD-10-CM | POA: Diagnosis not present

## 2018-06-11 DIAGNOSIS — R1319 Other dysphagia: Secondary | ICD-10-CM

## 2018-06-11 NOTE — Patient Instructions (Signed)
__________________________________________________________________   Angela Savage have been scheduled for an Upper GI Series at [facility].. Your appointment is on 06/17/18 at 10:30AM. Please arrive 15 minutes prior to your test for registration. Make sure not to eat or drink anything after midnight on the night before your test. If you need to reschedule, please call radiology at [phone]. ________________________________________________________________ An upper GI series uses x rays to help diagnose problems of the upper GI tract, which includes the esophagus, stomach, and duodenum. The duodenum is the first part of the small intestine. An upper GI series is conducted by a radiology technologist or a radiologist-a doctor who specializes in x-ray imaging-at a hospital or outpatient center. While sitting or standing in front of an x-ray machine, the patient drinks barium liquid, which is often white and has a chalky consistency and taste. The barium liquid coats the lining of the upper GI tract and makes signs of disease show up more clearly on x rays. X-ray video, called fluoroscopy, is used to view the barium liquid moving through the esophagus, stomach, and duodenum. Additional x rays and fluoroscopy are performed while the patient lies on an x-ray table. To fully coat the upper GI tract with barium liquid, the technologist or radiologist may press on the abdomen or ask the patient to change position. Patients hold still in various positions, allowing the technologist or radiologist to take x rays of the upper GI tract at different angles. If a technologist conducts the upper GI series, a radiologist will later examine the images to look for problems.  This test typically takes about 1 hour to complete.   We are giving you a dysphagia diet handout, follow level # 2.    I appreciate the opportunity to care for you. Silvano Rusk, MD,  [facility] __________________________________________________________________

## 2018-06-11 NOTE — Progress Notes (Signed)
Angela Savage 84 y.o. March 25, 1932 [id number]  Assessment & Plan:   Encounter Diagnoses  Name Primary?  . Esophageal dysphagia Yes  . Weight loss   . Abdominal pain, epigastric     Question motility disturbance, perhaps she has a paraesophageal hiatal hernia, upper GI tract malignancy is possible.  Given her overall functional status though she can tolerate an EGD I think it would be important to get noninvasive information so I will schedule a upper GI series first.  Further plans pending that.  She is able to stand some and I think she could transfer to a stretcher with minimal assistance so I do believe she is an acceptable candidate for the [place] should we need to do an endoscopy.  I did review with her the nature of endoscopy in the process.  I appreciate the opportunity to care for this patient. EH:[id number], [place], MD     Subjective:   Chief Complaint:  HPI The patient is complaining of a 55-month history of dysphagia.  She feels like there is a knot in the epigastric area when she swallows and the food does not pass.  She tries drinking Dr. Malachi Bonds standing up and moving around and it does not seem to go down.  I think it is more of a solid problem that it is liquids.  She had a problem late in 2018 around the holidays then it spontaneously remitted.  She thinks she is losing weight as well and the records below prove that.  She is not describing heartburn or regurgitation to any great degree.  She has some variable gas pain she describes in her abdomen.   Wt Readings from Last 3 Encounters:  01/15/18 123 lb 12.8 oz (56.2 kg)  11/11/15 136 lb 12.8 oz (62.1 kg)  10/26/15 135 lb 11.2 oz (61.6 kg)    Allergies  Allergen Reactions  . Avandia [Rosiglitazone Maleate] Other (See Comments)    Rapid heart rate  . Carbidopa-Levodopa   . Ciprofloxacin     Heart problems   . Codeine   . Glucovance [Glyburide-Metformin] Other (See Comments)    Rapid heart beat  .  Rosiglitazone Other (See Comments) and Palpitations    Chest pain.   Current Meds  Medication Sig  . acetaminophen (TYLENOL) 325 MG tablet Take 650 mg by mouth at bedtime.   . ALPRAZolam (XANAX) 1 MG tablet Take 2 mg by mouth daily.   . Alum Hydroxide-Mag Carbonate (GAVISCON PO) Take 1 tablet by mouth 3 (three) times daily before meals.  Marland Kitchen aspirin 81 MG tablet Take 81 mg by mouth daily.  Marland Kitchen bismuth subsalicylate (PEPTO BISMOL) 262 MG/15ML suspension Take by mouth as needed.  . Cholecalciferol (VITAMIN D3) 5000 units CAPS Take by mouth. Take 1 capsule 3 times a week  . fluticasone (FLONASE) 50 MCG/ACT nasal spray   . glucose blood test strip Accu-Chek Compact Test strips  . hydrochlorothiazide 25 MG tablet Take 25 mg by mouth every morning.    Marland Kitchen LANTUS SOLOSTAR 100 UNIT/ML Solostar Pen   . metFORMIN (GLUCOPHAGE) 1000 MG tablet Take 1,000 mg by mouth 2 (two) times daily with a meal.    . metoprolol succinate (TOPROL-XL) 50 MG 24 hr tablet   . potassium chloride SA (K-DUR,KLOR-CON) 20 MEQ tablet potassium chloride ER 20 mEq tablet,extended release(part/cryst)   Past Medical History:  Diagnosis Date  . Allergy   . Anemia   . Anxiety and depression   . Breast cancer ([place])   . Cataracts,  bilateral   . Diabetes mellitus   . Diverticulitis   . Endometrial cancer ([place])   . GERD (gastroesophageal reflux disease)   . Hyperlipidemia   . Hypertension   . Peptic ulcer   . Tremor   . Umbilical hernia    Past Surgical History:  Procedure Laterality Date  . [place]   right, after lumpectomy  . Bladder operation     Tumor ?  Marland Kitchen BREAST LUMPECTOMY  1997   right  . COLONOSCOPY  2008   Internal hemorrhoids, Dr. Vira Agar  . TOTAL ABDOMINAL HYSTERECTOMY W/ BILATERAL SALPINGOOPHORECTOMY  1955   malignant tumor  . UMBILICAL HERNIA REPAIR  2007   Social History   Social History Narrative   The patient is retired and widowed   She has 6 children she lives with 1 of  them   No alcohol or tobacco or drug use   family history includes Breast cancer in her daughter; Colon cancer in her maternal aunt; Diabetes in her father; Ovarian cancer in her sister; Stroke in her mother.   Review of Systems See HPI  Objective:   Physical Exam BP 134/80 (BP Location: Left Arm, Patient Position: Sitting, Cuff Size: Normal)   Pulse 92  NAD, chronically ill in a wheelchair Upper body tremor - diffuse Anicteric Dentures Lungs CTA Cor S1S2 abd small bulge to left of umbilicus no hernias present this is subcutaneous there are surgical scars present she has had an umbilical hernia repair

## 2018-06-17 ENCOUNTER — Ambulatory Visit
Admission: RE | Admit: 2018-06-17 | Discharge: 2018-06-17 | Disposition: A | Discharge status: Home / Self Care | Payer: Medicare Other | Source: Ambulatory Visit | Attending: Internal Medicine | Admitting: Internal Medicine

## 2018-06-17 DIAGNOSIS — K219 Gastro-esophageal reflux disease without esophagitis: Secondary | ICD-10-CM | POA: Insufficient documentation

## 2018-06-17 DIAGNOSIS — R1310 Dysphagia, unspecified: Secondary | ICD-10-CM | POA: Diagnosis present

## 2018-06-17 DIAGNOSIS — R1319 Other dysphagia: Secondary | ICD-10-CM

## 2018-06-17 NOTE — Progress Notes (Signed)
This test is ok  I recommend she have EGD dili at [facility]

## 2018-06-27 ENCOUNTER — Telehealth: Payer: Self-pay | Admitting: Internal Medicine

## 2018-06-27 NOTE — Telephone Encounter (Signed)
Patient notified that she should have the EGD.  Procedure and pre-visit rescheduled.

## 2018-06-27 NOTE — Telephone Encounter (Signed)
Patient canceled her pv and colon due to stating she can barley get out of bed and thinks she needs to be in the hosp. Patient wants to know if Dr.Gessner will order an MRI instead at the hosp in [place] to find out what is wrong with her. Please advise.

## 2018-07-05 ENCOUNTER — Encounter: Payer: Medicare Other | Admitting: Internal Medicine

## 2018-07-08 ENCOUNTER — Ambulatory Visit (AMBULATORY_SURGERY_CENTER): Payer: Self-pay

## 2018-07-08 VITALS — Ht 60.0 in | Wt 120.0 lb

## 2018-07-08 DIAGNOSIS — R1310 Dysphagia, unspecified: Secondary | ICD-10-CM

## 2018-07-08 NOTE — Progress Notes (Signed)
No egg or soy allergy known to patient  No issues with past sedation with any surgeries  or procedures, no intubation problems  No diet pills per patient No home 02 use per patient  No blood thinners per patient  Pt denies issues with constipation  No A fib or A flutter  EMMI video sent to pt's e mail. No EGD

## 2018-07-09 ENCOUNTER — Encounter: Payer: Self-pay | Admitting: Internal Medicine

## 2018-07-22 ENCOUNTER — Ambulatory Visit (AMBULATORY_SURGERY_CENTER): Payer: Medicare Other | Admitting: Internal Medicine

## 2018-07-22 ENCOUNTER — Encounter: Payer: Self-pay | Admitting: Internal Medicine

## 2018-07-22 VITALS — BP 122/59 | HR 83 | Temp 97.3°F | Resp 16 | Ht 60.0 in | Wt 120.0 lb

## 2018-07-22 DIAGNOSIS — K3189 Other diseases of stomach and duodenum: Secondary | ICD-10-CM | POA: Diagnosis not present

## 2018-07-22 DIAGNOSIS — R1310 Dysphagia, unspecified: Secondary | ICD-10-CM

## 2018-07-22 DIAGNOSIS — K259 Gastric ulcer, unspecified as acute or chronic, without hemorrhage or perforation: Secondary | ICD-10-CM

## 2018-07-22 DIAGNOSIS — K2970 Gastritis, unspecified, without bleeding: Secondary | ICD-10-CM

## 2018-07-22 DIAGNOSIS — R1319 Other dysphagia: Secondary | ICD-10-CM

## 2018-07-22 MED ORDER — SODIUM CHLORIDE 0.9 % IV SOLN: 500.0000 mL | Freq: Once | INTRAVENOUS | Status: DC

## 2018-07-22 NOTE — Op Note (Signed)
[facility] Patient Name: Angela Savage Procedure Date: 07/22/2018 1:50 PM MRN: [id number] Endoscopist: Gatha Mayer , MD Age: 84 Referring MD:  Date of Birth: Jun 12, 1932 Gender: Female Account #: [id number] Procedure:                Upper GI endoscopy Indications:              Epigastric abdominal pain, Dysphagia Medicines:                Propofol per Anesthesia, Monitored Anesthesia Care Procedure:                Pre-Anesthesia Assessment:                           - Prior to the procedure, a History and Physical                            was performed, and patient medications and                            allergies were reviewed. The patient's tolerance of                            previous anesthesia was also reviewed. The risks                            and benefits of the procedure and the sedation                            options and risks were discussed with the patient.                            All questions were answered, and informed consent                            was obtained. Prior Anticoagulants: The patient has                            taken no previous anticoagulant or antiplatelet                            agents. ASA Grade Assessment: III - A patient with                            severe systemic disease. After reviewing the risks                            and benefits, the patient was deemed in                            satisfactory condition to undergo the procedure.                           After obtaining informed consent, the endoscope was  passed under direct vision. Throughout the                            procedure, the patient's blood pressure, pulse, and                            oxygen saturations were monitored continuously. The                            Endoscope was introduced through the mouth, and                            advanced to the second part of duodenum. The upper                GI endoscopy was accomplished without difficulty.                            The patient tolerated the procedure well. Scope In: Scope Out: Findings:                 No endoscopic abnormality was evident in the                            esophagus to explain the patient's complaint of                            dysphagia.                           Localized mild inflammation characterized by                            congestion (edema), erosions and erythema was found                            in the prepyloric region of the stomach. Biopsies                            were taken with a cold forceps for histology.                            Verification of patient identification for the                            specimen was done. Estimated blood loss was minimal.                           The exam was otherwise without abnormality.                           The cardia and gastric fundus were normal on                            retroflexion. Complications:            No immediate complications. Estimated  Blood Loss:     Estimated blood loss was minimal. Impression:               - No endoscopic esophageal abnormality to explain                            patient's dysphagia.                           - Gastritis. Biopsied. May be from her ASA                           - The examination was otherwise normal. Recommendation:           - Patient has a contact number available for                            emergencies. The signs and symptoms of potential                            delayed complications were discussed with the                            patient. Return to normal activities tomorrow.                            Written discharge instructions were provided to the                            patient.                           - Resume previous diet.                           - Continue present medications.                           - Await pathology results.                            - If pathology results unhelpful may need CT scan                            abd/pelvis (? angio) given sxs and weight loss                           I did not think empiric dilation made sense -                            esophagus Nl here and at UGI Gatha Mayer, MD 07/22/2018 2:08:14 PM This report has been signed electronically.

## 2018-07-22 NOTE — Progress Notes (Signed)
Report given to PACU, vss 

## 2018-07-22 NOTE — Patient Instructions (Addendum)
The stomach looks irritated - we call that gastritis.  I took biopsies and will let you know results and plans  I appreciate the opportunity to care for you. Gatha Mayer, MD, FACG YOU HAD AN ENDOSCOPIC PROCEDURE TODAY AT [facility]:   Refer to the procedure report that was given to you for any specific questions about what was found during the examination.  If the procedure report does not answer your questions, please call your gastroenterologist to clarify.  If you requested that your care partner not be given the details of your procedure findings, then the procedure report has been included in a sealed envelope for you to review at your convenience later.  YOU SHOULD EXPECT: Some feelings of bloating in the abdomen. Passage of more gas than usual.  Walking can help get rid of the air that was put into your GI tract during the procedure and reduce the bloating. If you had a lower endoscopy (such as a colonoscopy or flexible sigmoidoscopy) you may notice spotting of blood in your stool or on the toilet paper. If you underwent a bowel prep for your procedure, you may not have a normal bowel movement for a few days.  Please Note:  You might notice some irritation and congestion in your nose or some drainage.  This is from the oxygen used during your procedure.  There is no need for concern and it should clear up in a day or so.  SYMPTOMS TO REPORT IMMEDIATELY:   Following upper endoscopy (EGD)  Vomiting of blood or coffee ground material  New chest pain or pain under the shoulder blades  Painful or persistently difficult swallowing  New shortness of breath  Fever of 100F or higher  Black, tarry-looking stools  For urgent or emergent issues, a gastroenterologist can be reached at any hour by calling [phone].   DIET:  We do recommend a small meal at first, but then you may proceed to your regular diet.  Drink plenty of fluids but you should avoid  alcoholic beverages for 24 hours.  MEDICATIONS: Continue present medications.  Please see handouts given to you by your recovery nurse.  ACTIVITY:  You should plan to take it easy for the rest of today and you should NOT DRIVE or use heavy machinery until tomorrow (because of the sedation medicines used during the test).    FOLLOW UP: Our staff will call the number listed on your records the next business day following your procedure to check on you and address any questions or concerns that you may have regarding the information given to you following your procedure. If we do not reach you, we will leave a message.  However, if you are feeling well and you are not experiencing any problems, there is no need to return our call.  We will assume that you have returned to your regular daily activities without incident.  If any biopsies were taken you will be contacted by phone or by letter within the next 1-3 weeks.  Please call us at [phone] if you have not heard about the biopsies in 3 weeks.   Thank you for allowing [place] to provide for your healthcare needs today.   SIGNATURES/CONFIDENTIALITY: You and/or your care partner have signed paperwork which will be entered into your electronic medical record.  These signatures attest to the fact that that the information above on your After Visit Summary has been reviewed and is understood.  Full responsibility  of the confidentiality of this discharge information lies with you and/or your care-partner.

## 2018-07-22 NOTE — Progress Notes (Signed)
Pt's states no medical or surgical changes since previsit or office visit. 

## 2018-07-22 NOTE — Progress Notes (Signed)
Called to room to assist during endoscopic procedure.  Patient ID and intended procedure confirmed with present staff. Received instructions for my participation in the procedure from the performing physician.  

## 2018-07-23 ENCOUNTER — Telehealth: Payer: Self-pay

## 2018-07-23 NOTE — Telephone Encounter (Signed)
  Follow up Call-  Call back number 07/22/2018  Post procedure Call Back phone  # [phone],  Permission to leave phone message Yes  comments dont think have answering machine & takes awhile to get to phone ,let it ring  Some recent data might be hidden     Patient questions:  Do you have a fever, pain , or abdominal swelling? No. Pain Score  0 *  Have you tolerated food without any problems? Yes.    Have you been able to return to your normal activities? Yes.    Do you have any questions about your discharge instructions: Diet   No. Medications  No. Follow up visit  No.  Do you have questions or concerns about your Care? No.  Actions: * If pain score is 4 or above: No action needed, pain <4.  No problems noted per pt. maw

## 2018-07-28 NOTE — Progress Notes (Signed)
[facility] - no recall or letter  Office  Please tell her stomach biopsies show mild inflammation but do not tell us why she is hurting and losing weight  Plan is a CT angio of abdomen/pelvis - she will need a BUN/creatinine  Dx is upper abdominal pain and weight loss

## 2018-08-09 ENCOUNTER — Ambulatory Visit
Admission: RE | Admit: 2018-08-09 | Discharge: 2018-08-09 | Disposition: A | Discharge status: Home / Self Care | Payer: Medicare Other | Source: Ambulatory Visit | Attending: Family Medicine | Admitting: Family Medicine

## 2018-08-09 ENCOUNTER — Other Ambulatory Visit: Payer: Self-pay | Admitting: Family Medicine

## 2018-08-09 ENCOUNTER — Inpatient Hospital Stay: Admit: 2018-08-09 | Payer: Self-pay

## 2018-08-09 DIAGNOSIS — M545 Low back pain, unspecified: Secondary | ICD-10-CM

## 2018-08-09 DIAGNOSIS — M5136 Other intervertebral disc degeneration, lumbar region: Secondary | ICD-10-CM | POA: Insufficient documentation

## 2018-08-09 DIAGNOSIS — M25552 Pain in left hip: Secondary | ICD-10-CM

## 2018-08-09 DIAGNOSIS — M1288 Other specific arthropathies, not elsewhere classified, other specified site: Secondary | ICD-10-CM | POA: Insufficient documentation

## 2018-10-07 ENCOUNTER — Other Ambulatory Visit: Payer: Self-pay

## 2018-10-07 ENCOUNTER — Emergency Department: Payer: Medicare Other

## 2018-10-07 ENCOUNTER — Observation Stay
Admission: EM | Admit: 2018-10-07 | Discharge: 2018-10-10 | Disposition: A | Discharge status: Home / Self Care | Payer: Medicare Other | Attending: Family Medicine | Admitting: Family Medicine

## 2018-10-07 ENCOUNTER — Encounter: Payer: Self-pay | Admitting: *Deleted

## 2018-10-07 DIAGNOSIS — I10 Essential (primary) hypertension: Secondary | ICD-10-CM | POA: Insufficient documentation

## 2018-10-07 DIAGNOSIS — K219 Gastro-esophageal reflux disease without esophagitis: Secondary | ICD-10-CM | POA: Diagnosis not present

## 2018-10-07 DIAGNOSIS — F419 Anxiety disorder, unspecified: Secondary | ICD-10-CM | POA: Insufficient documentation

## 2018-10-07 DIAGNOSIS — Z79899 Other long term (current) drug therapy: Secondary | ICD-10-CM | POA: Insufficient documentation

## 2018-10-07 DIAGNOSIS — Z7951 Long term (current) use of inhaled steroids: Secondary | ICD-10-CM | POA: Diagnosis not present

## 2018-10-07 DIAGNOSIS — E119 Type 2 diabetes mellitus without complications: Secondary | ICD-10-CM | POA: Diagnosis not present

## 2018-10-07 DIAGNOSIS — E876 Hypokalemia: Secondary | ICD-10-CM | POA: Insufficient documentation

## 2018-10-07 DIAGNOSIS — Z794 Long term (current) use of insulin: Secondary | ICD-10-CM | POA: Diagnosis not present

## 2018-10-07 DIAGNOSIS — G20A1 Parkinson's disease without dyskinesia, without mention of fluctuations: Secondary | ICD-10-CM | POA: Diagnosis present

## 2018-10-07 DIAGNOSIS — M8580 Other specified disorders of bone density and structure, unspecified site: Secondary | ICD-10-CM | POA: Diagnosis not present

## 2018-10-07 DIAGNOSIS — Z90710 Acquired absence of both cervix and uterus: Secondary | ICD-10-CM | POA: Diagnosis not present

## 2018-10-07 DIAGNOSIS — Z7982 Long term (current) use of aspirin: Secondary | ICD-10-CM | POA: Diagnosis not present

## 2018-10-07 DIAGNOSIS — E785 Hyperlipidemia, unspecified: Secondary | ICD-10-CM | POA: Diagnosis not present

## 2018-10-07 DIAGNOSIS — G20 Parkinson's disease: Secondary | ICD-10-CM | POA: Diagnosis present

## 2018-10-07 DIAGNOSIS — K4090 Unilateral inguinal hernia, without obstruction or gangrene, not specified as recurrent: Secondary | ICD-10-CM | POA: Diagnosis not present

## 2018-10-07 DIAGNOSIS — Z8711 Personal history of peptic ulcer disease: Secondary | ICD-10-CM | POA: Insufficient documentation

## 2018-10-07 DIAGNOSIS — R531 Weakness: Principal | ICD-10-CM

## 2018-10-07 DIAGNOSIS — Z8542 Personal history of malignant neoplasm of other parts of uterus: Secondary | ICD-10-CM | POA: Diagnosis not present

## 2018-10-07 DIAGNOSIS — N281 Cyst of kidney, acquired: Secondary | ICD-10-CM | POA: Insufficient documentation

## 2018-10-07 DIAGNOSIS — F329 Major depressive disorder, single episode, unspecified: Secondary | ICD-10-CM | POA: Insufficient documentation

## 2018-10-07 DIAGNOSIS — M79671 Pain in right foot: Secondary | ICD-10-CM

## 2018-10-07 DIAGNOSIS — I700 Atherosclerosis of aorta: Secondary | ICD-10-CM | POA: Insufficient documentation

## 2018-10-07 DIAGNOSIS — Z853 Personal history of malignant neoplasm of breast: Secondary | ICD-10-CM | POA: Insufficient documentation

## 2018-10-07 LAB — BASIC METABOLIC PANEL
ANION GAP: 10 (ref 5–15)
BUN: 20 mg/dL (ref 8–23)
CO2: 32 mmol/L (ref 22–32)
Calcium: 9.8 mg/dL (ref 8.9–10.3)
Chloride: 96 mmol/L — ABNORMAL LOW (ref 98–111)
Creatinine, Ser: 0.74 mg/dL (ref 0.44–1.00)
GFR calc non Af Amer: >60 mL/min (ref >60)
Glucose, Bld: 166 mg/dL — ABNORMAL HIGH (ref 70–99)
Potassium: 3.4 mmol/L — ABNORMAL LOW (ref 3.5–5.1)
Sodium: 138 mmol/L (ref 135–145)

## 2018-10-07 LAB — CBC
HCT: 38.7 % (ref 36.0–46.0)
Hemoglobin: 12.2 g/dL (ref 12.0–15.0)
MCH: 27.8 pg (ref 26.0–34.0)
MCHC: 31.5 g/dL (ref 30.0–36.0)
MCV: 88.2 fL (ref 80.0–100.0)
NRBC: 0 % (ref 0.0–0.2)
Platelets: 388 10*3/uL (ref 150–400)
RBC: 4.39 MIL/uL (ref 3.87–5.11)
RDW: 13.6 % (ref 11.5–15.5)
WBC: 11.3 10*3/uL — ABNORMAL HIGH (ref 4.0–10.5)

## 2018-10-07 LAB — URINALYSIS, COMPLETE (UACMP) WITH MICROSCOPIC
Bacteria, UA: NONE SEEN
Bilirubin Urine: NEGATIVE
Glucose, UA: NEGATIVE mg/dL
Ketones, ur: NEGATIVE mg/dL
Leukocytes, UA: NEGATIVE
Nitrite: NEGATIVE
Protein, ur: NEGATIVE mg/dL
Specific Gravity, Urine: 1.012 (ref 1.005–1.030)
pH: 5 (ref 5.0–8.0)

## 2018-10-07 LAB — TROPONIN I: Troponin I: <0.03 ng/mL (ref <0.03)

## 2018-10-07 LAB — GLUCOSE, CAPILLARY: Glucose-Capillary: 177 mg/dL — ABNORMAL HIGH (ref 70–99)

## 2018-10-07 MED ORDER — IOPAMIDOL (ISOVUE-300) INJECTION 61%
75.0000 mL | Freq: Once | INTRAVENOUS | Status: AC | PRN
  Administered 2018-10-07: 75 mL via INTRAVENOUS

## 2018-10-07 MED ORDER — IOPAMIDOL (ISOVUE-300) INJECTION 61%: 75.0000 mL | Freq: Once | INTRAVENOUS | Status: DC | PRN

## 2018-10-07 NOTE — ED Triage Notes (Signed)
Pt brought in by son via wheelchair.  Son reports frequent falls and generalized weakness for 2 weeks.  Pt  Reports aching all over   Pt alert.  Speech clear.  Hx parkinson's

## 2018-10-07 NOTE — ED Provider Notes (Signed)
[facility] Emergency Department Provider Note   ____________________________________________   I have reviewed the triage vital signs and the nursing notes.   HISTORY  Chief Complaint Fall and Weakness   History limited by: Not Limited   HPI Angela Savage is a 85 y.o. female who presents to the emergency department today because of concerns for increased weakness.  She states that she has been getting increasingly weak for the past 2 weeks.  Family says that for the past couple of days she is no longer able to get up by herself.  Normally she is independent and ambulatory by herself.  The patient feels generally weak.  She is also had some lower abdominal pain.  Appears that for the past few nights she has not been able to sleep because of the pain.  She denies any fevers.  Per medical record review patient has a history of HLD, HTN, parkinsons disease.  Past Medical History:  Diagnosis Date  . Allergy   . Anemia   . Anxiety and depression   . Breast cancer ([place])   . Cataracts, bilateral   . Diabetes mellitus   . Diverticulitis   . Endometrial cancer ([place])   . GERD (gastroesophageal reflux disease)   . Hyperlipidemia   . Hypertension   . Parkinson's disease ([place])   . Peptic ulcer   . Tremor   . Umbilical hernia     Patient Active Problem List   Diagnosis Date Noted  . Closed intertrochanteric fracture ([place]) 01/15/2018  . Disorder of bursae of shoulder region 01/15/2018  . Osteoarthritis of knee 01/15/2018  . History of breast cancer in female 12/10/2017  . HTN (hypertension) 09/21/2017  . Elevated hemoglobin A1c 09/21/2017  . 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 Hx of fracture of left hip 12/06/2015  . Benign essential hypertension 12/06/2015  . Type 2 diabetes mellitus without complication, without long-term current use of insulin ([place]) 12/06/2015  . Chronic UTI 10/27/2015  . Gross hematuria 10/27/2015  . Atrophic vaginitis  10/27/2015  . Dysfunctional gallbladder 07/06/2015  . CN (constipation) 01/06/2015  . H/O disease 01/06/2015  . Abdominal pain, left lower quadrant 01/06/2015  . Abdominal pain, right upper quadrant 01/06/2015  . H/O acute pancreatitis 01/06/2015    Past Surgical History:  Procedure Laterality Date  . [place]   right, after lumpectomy  . Bladder operation     Tumor ?  Marland Kitchen BREAST LUMPECTOMY  1997   right  . COLONOSCOPY  2008   Internal hemorrhoids, Dr. Vira Agar  . TOTAL ABDOMINAL HYSTERECTOMY W/ BILATERAL SALPINGOOPHORECTOMY  1955   malignant tumor  . UMBILICAL HERNIA REPAIR  2007    Prior to Admission medications   Medication Sig Start Date End Date Taking? Authorizing Provider  acetaminophen (TYLENOL) 325 MG tablet Take 650 mg by mouth at bedtime.     [provider]  ALPRAZolam Duanne Moron) 1 MG tablet Take 2 mg by mouth daily.  10/07/15   [provider]  Alum Hydroxide-Mag Carbonate (GAVISCON PO) Take 1 tablet by mouth 3 (three) times daily before meals.    [provider]  aspirin 81 MG tablet Take 81 mg by mouth daily.    [provider]  bismuth subsalicylate (PEPTO BISMOL) 262 MG/15ML suspension Take by mouth as needed.    [provider]  Cholecalciferol (VITAMIN D3) 5000 units CAPS Take by mouth. Take 1 capsule 3 times a week    [provider]  fluticasone (FLONASE) 50 MCG/ACT nasal spray  08/26/15   [provider]  glucose blood test strip Accu-Chek Compact Test strips    [provider]  hydrochlorothiazide 25 MG tablet Take 25 mg by mouth every morning.      [provider]  LANTUS SOLOSTAR 100 UNIT/ML Solostar Pen  08/11/15   [provider]  metFORMIN (GLUCOPHAGE) 1000 MG tablet Take 1,000 mg by mouth 2 (two) times daily with a meal.      [provider]  metoprolol succinate (TOPROL-XL) 50 MG 24 hr tablet  11/08/15   [provider]   potassium chloride SA (K-DUR,KLOR-CON) 20 MEQ tablet potassium chloride ER 20 mEq tablet,extended release(part/cryst)    [provider]    Allergies Avandia [rosiglitazone maleate]; Carbidopa-levodopa; Ciprofloxacin; Codeine; Glucovance [glyburide-metformin]; and Rosiglitazone  Family History  Problem Relation Age of Onset  . Colon cancer Maternal Aunt   . Breast cancer Daughter        sisters x3  . Ovarian cancer Sister   . Diabetes Father        children  . Stroke Mother   . Kidney disease Neg Hx   . Bladder Cancer Neg Hx   . Colon polyps Neg Hx     Social History Social History   Tobacco Use  . Smoking status: Never Smoker  . Smokeless tobacco: Never Used  Substance Use Topics  . Alcohol use: No  . Drug use: No    Review of Systems Constitutional: No fever/chills. Positive for generalized weakness.  Eyes: No visual changes. ENT: No sore throat. Cardiovascular: Denies chest pain. Respiratory: Denies shortness of breath. Gastrointestinal: Positive for lower abdominal pain.  Genitourinary: Negative for dysuria. Musculoskeletal: Negative for back pain. Skin: Negative for rash. Neurological: Negative for headaches, focal weakness or numbness.  ____________________________________________   PHYSICAL EXAM:  VITAL SIGNS: ED Triage Vitals  Enc Vitals Group     BP 10/07/18 1705 121/88     Pulse Rate 10/07/18 1705 92     Resp 10/07/18 1705 18     Temp 10/07/18 1705 98.4 F (36.9 C)     Temp Source 10/07/18 1705 Oral     SpO2 10/07/18 1705 96 %     Weight 10/07/18 1702 120 lb (54.4 kg)     Height 10/07/18 1702 5\' 3"  (1.6 m)     Head Circumference --      Peak Flow --      Pain Score 10/07/18 1702 0   Constitutional: Alert and oriented.  Eyes: Conjunctivae are normal.  ENT      Head: Normocephalic and atraumatic.      Nose: No congestion/rhinnorhea.      Mouth/Throat: Mucous membranes are moist.      Neck: No  stridor. Hematological/Lymphatic/Immunilogical: No cervical lymphadenopathy. Cardiovascular: Normal rate, regular rhythm.  No murmurs, rubs, or gallops.  Respiratory: Normal respiratory effort without tachypnea nor retractions. Breath sounds are clear and equal bilaterally. No wheezes/rales/rhonchi. Gastrointestinal: Soft and non tender. No rebound. No guarding.  Genitourinary: Deferred Musculoskeletal: Normal range of motion in all extremities. No lower extremity edema. Neurologic:  Normal speech and language. No gross focal neurologic deficits are appreciated.  Skin:  Skin is warm, dry and intact. No rash noted. Psychiatric: Mood and affect are normal. Speech and behavior are normal. Patient exhibits appropriate insight and judgment.  ____________________________________________    LABS (pertinent positives/negatives)  Trop <0.03 CBC wbc 11.3, hgb 12.2, plt 388 Bmp wnl except k 3.4, cl 96,  glu 166 otherwise wnl UA clear, small hgb dipstick, 6-10 RBC, 0-5 wbc ____________________________________________   EKG  I, Nance Pear, attending physician, personally viewed and interpreted this EKG  EKG Time: 1706 Rate: 99 Rhythm: unclear, possible sinus rhythm Axis: left axis deviation Intervals: qtc 464 QRS: narrow, q waves v1 ST changes: no st elevation Impression: abnormal ekg. Artifact limits interpretation   ____________________________________________    RADIOLOGY  CXR No acute abnormality  CT abd/pel  No acute findings ____________________________________________   PROCEDURES  Procedures  ____________________________________________   INITIAL IMPRESSION / ASSESSMENT AND PLAN / ED COURSE  Pertinent labs & imaging results that were available during my care of the patient were reviewed by me and considered in my medical decision making (see chart for details).   Patient presented to the emergency department today because of concerns for increased weakness.   The patient does have a history of Parkinson's. ddx would include infection, acs, dehydration, worsening of chronic disease amongst other etiologies. Work up without any obvious findings. Discussed with hospitalist for admission.   ____________________________________________   FINAL CLINICAL IMPRESSION(S) / ED DIAGNOSES  Final diagnoses:  Weakness     Note: This dictation was prepared with Dragon dictation. Any transcriptional errors that result from this process are unintentional     Nance Pear, MD 10/08/18 1620

## 2018-10-07 NOTE — ED Triage Notes (Signed)
First Nurse NOte:  ARrives with daughter for ED evaluation and possible SNF placement for progressive weakness.

## 2018-10-07 NOTE — ED Notes (Signed)
Nurse assisted pt to restroom. Pt has had issues with ambulation more recently, within the last two weeks, per pt's son. Pt had a home health tech but told him to stop coming because "he was too rough." Pt is unable to stand or transfer without assist.

## 2018-10-07 NOTE — ED Notes (Signed)
Patient transported to CT 

## 2018-10-07 NOTE — H&P (Addendum)
History and Physical   [facility] [place] [facility] Admission History and Physical [employer], D.O.    Patient Name: Angela Savage MR#: [id number] Date of Birth: 16-Mar-1932 Date of Admission: 10/07/2018  Referring MD/NP/PA: Dr. Archie Balboa Primary Care Physician: Remi Haggard, FNP  Chief Complaint:  Chief Complaint  Patient presents with  . Fall  . Weakness    HPI: Angela Savage is a 85 y.o. female with a known history of Parkinson's, hypertension, hyperlipidemia, diabetes, anxiety and depression presents to the emergency department for evaluation of weakness status post fall.  Patient was in a usual state of health until 2 weeks ago when she describes the onset of aggressively worsening weakness, difficulty ambulating, requiring assistance with activities of daily living. States that she falls every day at home and wants to go to rehab to get stronger. She lives independently and uses a walker at home.   Patient denies fevers/chills, diizziness, chest pain, shortness of breath, N/V/C/D, abdominal pain, dysuria/frequency, changes in mental status.    EMS/ED Course: Patient received normal saline. Medical admission has been requested for further management and work-up of weakness  Review of Systems:  CONSTITUTIONAL: No fever/chills, fatigue, weight gain/loss, headache. EYES: No blurry or double vision. ENT: No tinnitus, postnasal drip, redness or soreness of the oropharynx. RESPIRATORY: No cough, dyspnea, wheeze.  No hemoptysis.  CARDIOVASCULAR: No chest pain, palpitations, syncope, orthopnea. No lower extremity edema.  GASTROINTESTINAL: No nausea, vomiting, abdominal pain, diarrhea, constipation.  No hematemesis, melena or hematochezia. GENITOURINARY: No dysuria, frequency, hematuria. ENDOCRINE: No polyuria or nocturia. No heat or cold intolerance. HEMATOLOGY: No anemia, bruising, bleeding. INTEGUMENTARY: No rashes, ulcers,  lesions. MUSCULOSKELETAL: No arthritis, gout. NEUROLOGIC: Positive weakness, falls. No numbness, tingling, ataxia, seizure-type activity, weakness. PSYCHIATRIC: No anxiety, depression, insomnia.   Past Medical History:  Diagnosis Date  . Allergy   . Anemia   . Anxiety and depression   . Breast cancer ([place])   . Cataracts, bilateral   . Diabetes mellitus   . Diverticulitis   . Endometrial cancer ([place])   . GERD (gastroesophageal reflux disease)   . Hyperlipidemia   . Hypertension   . Parkinson's disease ([place])   . Peptic ulcer   . Tremor   . Umbilical hernia     Past Surgical History:  Procedure Laterality Date  . [place]   right, after lumpectomy  . Bladder operation     Tumor ?  Marland Kitchen BREAST LUMPECTOMY  1997   right  . COLONOSCOPY  2008   Internal hemorrhoids, Dr. Vira Agar  . TOTAL ABDOMINAL HYSTERECTOMY W/ BILATERAL SALPINGOOPHORECTOMY  1955   malignant tumor  . UMBILICAL HERNIA REPAIR  2007     reports that she has never smoked. She has never used smokeless tobacco. She reports that she does not drink alcohol or use drugs.  Allergies  Allergen Reactions  . Avandia [Rosiglitazone Maleate] Other (See Comments)    Rapid heart rate  . Carbidopa-Levodopa   . Ciprofloxacin     Heart problems   . Codeine   . Glucovance [Glyburide-Metformin] Other (See Comments)    Rapid heart beat  . Rosiglitazone Other (See Comments) and Palpitations    Chest pain.    Family History  Problem Relation Age of Onset  . Colon cancer Maternal Aunt   . Breast cancer Daughter        sisters x3  . Ovarian cancer Sister   . Diabetes Father  children  . Stroke Mother   . Kidney disease Neg Hx   . Bladder Cancer Neg Hx   . Colon polyps Neg Hx     Prior to Admission medications   Medication Sig Start Date End Date Taking? Authorizing Provider  acetaminophen (TYLENOL) 325 MG tablet Take 650 mg by mouth every 4 (four) hours as needed.    Yes [provider]  ALPRAZolam Duanne Moron) 1 MG tablet Take 1 mg by mouth 2 (two) times daily.  10/07/15  Yes [provider]  Alum Hydroxide-Mag Carbonate (GAVISCON PO) Take 1 tablet by mouth 3 (three) times daily before meals.   Yes [provider]  bismuth subsalicylate (PEPTO BISMOL) 262 MG/15ML suspension Take by mouth as needed.   Yes [provider]  hydrochlorothiazide 25 MG tablet Take 25 mg by mouth every morning.     Yes [provider]  metFORMIN (GLUCOPHAGE) 1000 MG tablet Take 1,000 mg by mouth 2 (two) times daily with a meal.     Yes [provider]  metoprolol succinate (TOPROL-XL) 50 MG 24 hr tablet  11/08/15  Yes [provider]  potassium chloride SA (K-DUR,KLOR-CON) 20 MEQ tablet Take 20 mEq by mouth daily.    Yes [provider]  Pumpkin Seed-Soy Germ (AZO BLADDER CONTROL/GO-LESS PO) Take 1 tablet by mouth daily.   Yes [provider]  aspirin 81 MG tablet Take 81 mg by mouth daily.    [provider]  Cholecalciferol (VITAMIN D3) 5000 units CAPS Take by mouth. Take 1 capsule 3 times a week    [provider]  fluticasone (FLONASE) 50 MCG/ACT nasal spray  08/26/15   [provider]  glucose blood test strip Accu-Chek Compact Test strips    [provider]  LANTUS SOLOSTAR 100 UNIT/ML Solostar Pen  08/11/15   [provider]    Physical Exam: Vitals:   10/07/18 1838 10/07/18 1930 10/07/18 2000 10/07/18 2249  BP: 117/87 126/73 113/74 131/76  Pulse: 91 86 77 87  Resp: 20 16  16   Temp:    98.1 F (36.7 C)  TempSrc:      SpO2: 95% 95% 96% 94%  Weight:      Height:        GENERAL: a frail  85 y.o.-year-old female patient, lying in the bed in no acute distress.  Pleasant and cooperative.   HEENT: Head atraumatic, normocephalic. Pupils equal. Mucus membranes dry NECK: Supple. No JVD. CHEST: Normal breath sounds bilaterally. No wheezing, rales, rhonchi or crackles. No  use of accessory muscles of respiration.  No reproducible chest wall tenderness.  CARDIOVASCULAR: S1, S2 normal. No murmurs, rubs, or gallops. Cap refill <2 seconds. Pulses intact distally.  ABDOMEN: Soft, nondistended, nontender. No rebound, guarding, rigidity. Normoactive bowel sounds present in all four quadrants.  EXTREMITIES: No pedal edema, cyanosis, or clubbing. No calf tenderness or Homan's sign.  NEUROLOGIC: Bilateral upper extremity tremor. The patient is alert and oriented x 3. Cranial nerves II through XII are grossly intact with no focal sensorimotor deficit. PSYCHIATRIC:  Normal affect, mood, thought content. SKIN: Warm, dry, and intact without obvious rash, lesion, or ulcer.    Labs on Admission:  CBC: Recent Labs  Lab 10/07/18 1706  WBC 11.3*  HGB 12.2  HCT 38.7  MCV 88.2  PLT 242   Basic Metabolic Panel: Recent Labs  Lab 10/07/18 1706  NA 138  K 3.4*  CL 96*  CO2 32  GLUCOSE 166*  BUN  20  CREATININE 0.74  CALCIUM 9.8   GFR: Estimated Creatinine Clearance: 43.3 mL/min (by C-G formula based on SCr of 0.74 mg/dL). Liver Function Tests: No results for input(s): AST, ALT, ALKPHOS, BILITOT, PROT, ALBUMIN in the last 168 hours. No results for input(s): LIPASE, AMYLASE in the last 168 hours. No results for input(s): AMMONIA in the last 168 hours. Coagulation Profile: No results for input(s): INR, PROTIME in the last 168 hours. Cardiac Enzymes: Recent Labs  Lab 10/07/18 1706  TROPONINI <0.03   BNP (last 3 results) No results for input(s): PROBNP in the last 8760 hours. HbA1C: No results for input(s): HGBA1C in the last 72 hours. CBG: Recent Labs  Lab 10/07/18 2149  GLUCAP 177*   Lipid Profile: No results for input(s): CHOL, HDL, LDLCALC, TRIG, CHOLHDL, LDLDIRECT in the last 72 hours. Thyroid Function Tests: No results for input(s): TSH, T4TOTAL, FREET4, T3FREE, THYROIDAB in the last 72 hours. Anemia Panel: No results for input(s): VITAMINB12,  FOLATE, FERRITIN, TIBC, IRON, RETICCTPCT in the last 72 hours. Urine analysis:    Component Value Date/Time   COLORURINE YELLOW (A) 10/07/2018 1849   APPEARANCEUR CLEAR (A) 10/07/2018 1849   APPEARANCEUR Clear 01/16/2018 1541   LABSPEC 1.012 10/07/2018 1849   LABSPEC 1.013 03/20/2014 0944   PHURINE 5.0 10/07/2018 1849   GLUCOSEU NEGATIVE 10/07/2018 1849   GLUCOSEU Negative 03/20/2014 0944   HGBUR SMALL (A) 10/07/2018 1849   BILIRUBINUR NEGATIVE 10/07/2018 1849   BILIRUBINUR Negative 01/16/2018 1541   BILIRUBINUR Negative 03/20/2014 0944   KETONESUR NEGATIVE 10/07/2018 1849   PROTEINUR NEGATIVE 10/07/2018 1849   NITRITE NEGATIVE 10/07/2018 1849   LEUKOCYTESUR NEGATIVE 10/07/2018 1849   LEUKOCYTESUR Negative 01/16/2018 1541   LEUKOCYTESUR 1+ 03/20/2014 0944   Sepsis Labs: @LABRCNTIP (procalcitonin:4,lacticidven:4) )No results found for this or any previous visit (from the past 240 hour(s)).   Radiological Exams on Admission: Dg Chest 2 View  Result Date: 10/07/2018 CLINICAL DATA:  Generalized weakness for the past 2 weeks. Diffuse myalgias. EXAM: CHEST - 2 VIEW COMPARISON:  03/19/2014. FINDINGS: Normal sized heart. Tortuous and partially calcified thoracic aorta. The lungs remain clear with stable mild hyperexpansion of the lungs and mild diffuse peribronchial thickening and accentuation of the interstitial markings. Stable left diaphragmatic eventration and right axillary surgical clips. Diffuse osteopenia. IMPRESSION: No acute abnormality. Stable mild changes of COPD and chronic bronchitis. Electronically Signed   By: Claudie Revering M.D.   On: 10/07/2018 17:42   Ct Abdomen Pelvis W Contrast  Result Date: 10/07/2018 CLINICAL DATA:  85 year old female with weakness for 2 weeks. Frequent falls. EXAM: CT ABDOMEN AND PELVIS WITH CONTRAST TECHNIQUE: Multidetector CT imaging of the abdomen and pelvis was performed using the standard protocol following bolus administration of intravenous  contrast. CONTRAST:  23mL ISOVUE-300 IOPAMIDOL (ISOVUE-300) INJECTION 61% COMPARISON:  CT Abdomen and Pelvis 11/05/2015 and earlier. FINDINGS: Lower chest: Negative; small fat containing Bochdalek's hernia (normal variant). Hepatobiliary: Negative liver and gallbladder. Pancreas: Negative aside from chronic pancreatic atrophy. Spleen: Negative. Adrenals/Urinary Tract: Normal adrenal glands. Chronic benign right renal cysts. Tiny left renal cysts. Symmetric renal enhancement and contrast excretion. Decompressed proximal ureters. Diminutive and unremarkable urinary bladder. Stomach/Bowel: Mild distension of the rectum by gas and stool. Decompressed distal sigmoid. Mildly redundant and gas-filled proximal sigmoid. Similar gas in the descending and transverse colon segments which are mildly redundant. Negative right colon. Occasional large bowel diverticula but no active inflammation. Negative terminal ileum. Diminutive or absent appendix. No dilated small bowel. Negative stomach and duodenum. No  free air, free fluid. Vascular/Lymphatic: Mild Aortoiliac calcified atherosclerosis. Major arterial structures in the abdomen and pelvis are patent. Portal venous system is patent. No lymphadenopathy. Reproductive: Surgically absent uterus with diminutive or absent ovaries. Other: No pelvic free fluid. Small fat containing left inguinal hernia is stable. Musculoskeletal: Osteopenia. Chronic proximal left femur ORIF. No acute osseous abnormality identified. IMPRESSION: Stable since 2017. No acute or inflammatory process in the abdomen or pelvis. Electronically Signed   By: Genevie Ann M.D.   On: 10/07/2018 20:36    EKG: Normal sinus rhythm at 99 bpm with leftward axis and nonspecific ST-T wave changes.   Assessment/Plan  This is a 85 y.o. female with a history of Parkinson's, hypertension, hyperlipidemia, diabetes, anxiety and depression now being admitted with:  #.  Weakness, status post fall -Admit to inpatient, tele  monitoring to rule out arrhythmia Gentle IV fluid hydration Check head CT Reduce benzo dosage Consider neuro consult PT evaluation for consideration of rehabilitation  #.  Mild hypokalemia -Replace orally - Check mag and phos  #.  History of hypertension -Continue hydrochlorothiazide, metoprolol  #. History of Diabetes - Accuchecks achs with RISS coverage - Heart healthy, carb controlled diet -Continue Lantus -Hold metformin  Admission status: Inpatient, telemetry monitoring IV Fluids: Saline Diet/Nutrition: Heart healthy carb controlled Consults called: Physical therapy DVT Px: Lovenox, SCDs and early ambulation. Code Status: Full Code  Disposition Plan: To be determined  All the records are reviewed and case discussed with ED provider. Management plans discussed with the patient and/or family who express understanding and agree with plan of care.  Harvie Bridge D.O. on 10/07/2018 at 11:22 PM CC: Primary care physician; Remi Haggard, FNP   10/07/2018, 11:22 PM

## 2018-10-07 NOTE — ED Notes (Signed)
ED Provider at bedside. 

## 2018-10-08 ENCOUNTER — Inpatient Hospital Stay: Payer: Medicare Other

## 2018-10-08 ENCOUNTER — Other Ambulatory Visit: Payer: Self-pay

## 2018-10-08 LAB — HEMOGLOBIN A1C
Hgb A1c MFr Bld: 6.9 % — ABNORMAL HIGH (ref 4.8–5.6)
Mean Plasma Glucose: 151.33 mg/dL

## 2018-10-08 LAB — CBC
HCT: 36.2 % (ref 36.0–46.0)
Hemoglobin: 11.5 g/dL — ABNORMAL LOW (ref 12.0–15.0)
MCH: 27.8 pg (ref 26.0–34.0)
MCHC: 31.8 g/dL (ref 30.0–36.0)
MCV: 87.4 fL (ref 80.0–100.0)
PLATELETS: 304 10*3/uL (ref 150–400)
RBC: 4.14 MIL/uL (ref 3.87–5.11)
RDW: 13.6 % (ref 11.5–15.5)
WBC: 9.8 10*3/uL (ref 4.0–10.5)
nRBC: 0 % (ref 0.0–0.2)

## 2018-10-08 LAB — COMPREHENSIVE METABOLIC PANEL
ALK PHOS: 36 U/L — AB (ref 38–126)
ALT: 15 U/L (ref 0–44)
AST: 25 U/L (ref 15–41)
Albumin: 3.8 g/dL (ref 3.5–5.0)
Anion gap: 8 (ref 5–15)
BUN: 15 mg/dL (ref 8–23)
CO2: 32 mmol/L (ref 22–32)
CREATININE: 0.58 mg/dL (ref 0.44–1.00)
Calcium: 9.2 mg/dL (ref 8.9–10.3)
Chloride: 98 mmol/L (ref 98–111)
GFR calc Af Amer: >60 mL/min (ref >60)
GFR calc non Af Amer: >60 mL/min (ref >60)
Glucose, Bld: 154 mg/dL — ABNORMAL HIGH (ref 70–99)
Potassium: 3.5 mmol/L (ref 3.5–5.1)
Sodium: 138 mmol/L (ref 135–145)
Total Bilirubin: 1.2 mg/dL (ref 0.3–1.2)
Total Protein: 7 g/dL (ref 6.5–8.1)

## 2018-10-08 LAB — GLUCOSE, CAPILLARY
GLUCOSE-CAPILLARY: 160 mg/dL — AB (ref 70–99)
Glucose-Capillary: 121 mg/dL — ABNORMAL HIGH (ref 70–99)
Glucose-Capillary: 131 mg/dL — ABNORMAL HIGH (ref 70–99)
Glucose-Capillary: 177 mg/dL — ABNORMAL HIGH (ref 70–99)

## 2018-10-08 LAB — TSH: TSH: 1.875 u[IU]/mL (ref 0.350–4.500)

## 2018-10-08 LAB — TROPONIN I
Troponin I: <0.03 ng/mL (ref <0.03)
Troponin I: <0.03 ng/mL (ref <0.03)
Troponin I: <0.03 ng/mL (ref <0.03)

## 2018-10-08 LAB — MAGNESIUM: Magnesium: 1.6 mg/dL — ABNORMAL LOW (ref 1.7–2.4)

## 2018-10-08 LAB — PHOSPHORUS: Phosphorus: 3.4 mg/dL (ref 2.5–4.6)

## 2018-10-08 MED ORDER — INSULIN GLARGINE 100 UNIT/ML ~~LOC~~ SOLN
14.0000 [IU] | Freq: Every day | SUBCUTANEOUS | Status: DC
  Administered 2018-10-08 – 2018-10-10 (×3): 14 [IU] via SUBCUTANEOUS
  Filled 2018-10-08 (×5): qty 0.14

## 2018-10-08 MED ORDER — ACETAMINOPHEN 325 MG PO TABS
650.0000 mg | ORAL_TABLET | ORAL | Status: DC | PRN
  Administered 2018-10-08 – 2018-10-10 (×5): 650 mg via ORAL
  Filled 2018-10-08 (×5): qty 2

## 2018-10-08 MED ORDER — HYDROCHLOROTHIAZIDE 25 MG PO TABS
25.0000 mg | ORAL_TABLET | ORAL | Status: DC
  Administered 2018-10-08 – 2018-10-10 (×3): 25 mg via ORAL
  Filled 2018-10-08 (×3): qty 1

## 2018-10-08 MED ORDER — INSULIN ASPART 100 UNIT/ML ~~LOC~~ SOLN: 0.0000 [IU] | Freq: Every day | SUBCUTANEOUS | Status: DC

## 2018-10-08 MED ORDER — TRAMADOL HCL 50 MG PO TABS
50.0000 mg | ORAL_TABLET | Freq: Four times a day (QID) | ORAL | Status: DC | PRN
  Administered 2018-10-08 – 2018-10-09 (×2): 50 mg via ORAL
  Filled 2018-10-08 (×2): qty 1

## 2018-10-08 MED ORDER — MAGNESIUM CITRATE PO SOLN
1.0000 | Freq: Once | ORAL | Status: DC | PRN
  Filled 2018-10-08: qty 296

## 2018-10-08 MED ORDER — ASPIRIN 81 MG PO CHEW
81.0000 mg | CHEWABLE_TABLET | Freq: Every day | ORAL | Status: DC
  Administered 2018-10-08 – 2018-10-10 (×3): 81 mg via ORAL
  Filled 2018-10-08 (×3): qty 1

## 2018-10-08 MED ORDER — FLUTICASONE PROPIONATE 50 MCG/ACT NA SUSP
2.0000 | Freq: Every day | NASAL | Status: DC
  Administered 2018-10-10: 08:00:00 2 via NASAL
  Filled 2018-10-08 (×2): qty 16

## 2018-10-08 MED ORDER — ONDANSETRON HCL 4 MG PO TABS: 4.0000 mg | ORAL_TABLET | Freq: Four times a day (QID) | ORAL | Status: DC | PRN

## 2018-10-08 MED ORDER — INSULIN ASPART 100 UNIT/ML ~~LOC~~ SOLN
0.0000 [IU] | Freq: Three times a day (TID) | SUBCUTANEOUS | Status: DC
  Administered 2018-10-08: 2 [IU] via SUBCUTANEOUS
  Administered 2018-10-08 – 2018-10-09 (×2): 3 [IU] via SUBCUTANEOUS
  Administered 2018-10-09: 18:00:00 8 [IU] via SUBCUTANEOUS
  Administered 2018-10-09: 12:00:00 3 [IU] via SUBCUTANEOUS
  Administered 2018-10-10: 11 [IU] via SUBCUTANEOUS
  Administered 2018-10-10: 13:00:00 2 [IU] via SUBCUTANEOUS
  Filled 2018-10-08 (×8): qty 1

## 2018-10-08 MED ORDER — SODIUM CHLORIDE 0.9 % IV SOLN
INTRAVENOUS | Status: DC
  Administered 2018-10-08 – 2018-10-09 (×2): via INTRAVENOUS

## 2018-10-08 MED ORDER — METOPROLOL SUCCINATE ER 50 MG PO TB24
50.0000 mg | ORAL_TABLET | Freq: Every day | ORAL | Status: DC
  Administered 2018-10-08 – 2018-10-10 (×3): 50 mg via ORAL
  Filled 2018-10-08 (×3): qty 1

## 2018-10-08 MED ORDER — MAGNESIUM SULFATE 2 GM/50ML IV SOLN
2.0000 g | Freq: Once | INTRAVENOUS | Status: AC
  Administered 2018-10-08: 2 g via INTRAVENOUS
  Filled 2018-10-08: qty 50

## 2018-10-08 MED ORDER — ONDANSETRON HCL 4 MG/2ML IJ SOLN: 4.0000 mg | Freq: Four times a day (QID) | INTRAMUSCULAR | Status: DC | PRN

## 2018-10-08 MED ORDER — INSULIN GLARGINE 100 UNIT/ML SOLOSTAR PEN: 14.0000 [IU] | PEN_INJECTOR | Freq: Every day | SUBCUTANEOUS | Status: DC

## 2018-10-08 MED ORDER — ALPRAZOLAM 0.5 MG PO TABS
0.5000 mg | ORAL_TABLET | Freq: Two times a day (BID) | ORAL | Status: DC
  Administered 2018-10-08 – 2018-10-09 (×3): 0.5 mg via ORAL
  Filled 2018-10-08 (×4): qty 1

## 2018-10-08 MED ORDER — SENNOSIDES-DOCUSATE SODIUM 8.6-50 MG PO TABS: 1.0000 | ORAL_TABLET | Freq: Every evening | ORAL | Status: DC | PRN

## 2018-10-08 MED ORDER — POTASSIUM CHLORIDE CRYS ER 20 MEQ PO TBCR
20.0000 meq | EXTENDED_RELEASE_TABLET | Freq: Every day | ORAL | Status: DC
  Administered 2018-10-09 – 2018-10-10 (×2): 20 meq via ORAL
  Filled 2018-10-08 (×4): qty 1

## 2018-10-08 MED ORDER — BISACODYL 5 MG PO TBEC: 5.0000 mg | DELAYED_RELEASE_TABLET | Freq: Every day | ORAL | Status: DC | PRN

## 2018-10-08 MED ORDER — ENOXAPARIN SODIUM 40 MG/0.4ML ~~LOC~~ SOLN
40.0000 mg | SUBCUTANEOUS | Status: DC
  Administered 2018-10-08 – 2018-10-09 (×3): 40 mg via SUBCUTANEOUS
  Filled 2018-10-08 (×3): qty 0.4

## 2018-10-08 NOTE — Progress Notes (Signed)
[facility] NAME: Angela Savage    MR#:  [id number]  DATE OF BIRTH:  June 13, 1932  SUBJECTIVE:  Patient complains of generalized weakness/fatigue, no events overnight, physical therapy input appreciated, neurology to see given history of Parkinson's disease-not on medications, OT/speech therapy/dietary to evaluate  REVIEW OF SYSTEMS:  CONSTITUTIONAL: No fever, fatigue or weakness.  EYES: No blurred or double vision.  EARS, NOSE, AND THROAT: No tinnitus or ear pain.  RESPIRATORY: No cough, shortness of breath, wheezing or hemoptysis.  CARDIOVASCULAR: No chest pain, orthopnea, edema.  GASTROINTESTINAL: No nausea, vomiting, diarrhea or abdominal pain.  GENITOURINARY: No dysuria, hematuria.  ENDOCRINE: No polyuria, nocturia,  HEMATOLOGY: No anemia, easy bruising or bleeding SKIN: No rash or lesion. MUSCULOSKELETAL: No joint pain or arthritis.   NEUROLOGIC: No tingling, numbness, weakness.  PSYCHIATRY: No anxiety or depression.   ROS  DRUG ALLERGIES:   Allergies  Allergen Reactions  . Avandia [Rosiglitazone Maleate] Other (See Comments)    Rapid heart rate  . Carbidopa-Levodopa   . Ciprofloxacin     Heart problems   . Codeine   . Glucovance [Glyburide-Metformin] Other (See Comments)    Rapid heart beat  . Rosiglitazone Other (See Comments) and Palpitations    Chest pain.    VITALS:  Blood pressure 129/83, pulse 81, temperature 98.1 F (36.7 C), resp. rate 18, height 5\' 3"  (1.6 m), weight 54.4 kg, SpO2 96 %.  PHYSICAL EXAMINATION:  GENERAL:  85 y.o.-year-old patient lying in the bed with no acute distress.  EYES: Pupils equal, round, reactive to light and accommodation. No scleral icterus. Extraocular muscles intact.  HEENT: Head atraumatic, normocephalic. Oropharynx and nasopharynx clear.  NECK:  Supple, no jugular venous distention. No thyroid enlargement, no tenderness.  LUNGS: Normal breath sounds bilaterally, no  wheezing, rales,rhonchi or crepitation. No use of accessory muscles of respiration.  CARDIOVASCULAR: S1, S2 normal. No murmurs, rubs, or gallops.  ABDOMEN: Soft, nontender, nondistended. Bowel sounds present. No organomegaly or mass.  EXTREMITIES: No pedal edema, cyanosis, or clubbing.  NEUROLOGIC: Cranial nerves II through XII are intact. Muscle strength 5/5 in all extremities. Sensation intact. Gait not checked.  PSYCHIATRIC: The patient is alert and oriented x 3.  SKIN: No obvious rash, lesion, or ulcer.   Physical Exam LABORATORY PANEL:   CBC Recent Labs  Lab 10/08/18 0509  WBC 9.8  HGB 11.5*  HCT 36.2  PLT 304   ------------------------------------------------------------------------------------------------------------------  Chemistries  Recent Labs  Lab 10/08/18 0117 10/08/18 0509  NA  --  138  K  --  3.5  CL  --  98  CO2  --  32  GLUCOSE  --  154*  BUN  --  15  CREATININE  --  0.58  CALCIUM  --  9.2  MG 1.6*  --   AST  --  25  ALT  --  15  ALKPHOS  --  36*  BILITOT  --  1.2   ------------------------------------------------------------------------------------------------------------------  Cardiac Enzymes Recent Labs  Lab 10/08/18 0117 10/08/18 0509  TROPONINI <0.03 <0.03   ------------------------------------------------------------------------------------------------------------------  RADIOLOGY:  Dg Chest 2 View  Result Date: 10/07/2018 CLINICAL DATA:  Generalized weakness for the past 2 weeks. Diffuse myalgias. EXAM: CHEST - 2 VIEW COMPARISON:  03/19/2014. FINDINGS: Normal sized heart. Tortuous and partially calcified thoracic aorta. The lungs remain clear with stable mild hyperexpansion of the lungs and mild diffuse peribronchial thickening and accentuation of the interstitial markings. Stable left diaphragmatic eventration and  right axillary surgical clips. Diffuse osteopenia. IMPRESSION: No acute abnormality. Stable mild changes of COPD and chronic  bronchitis. Electronically Signed   By: Claudie Revering M.D.   On: 10/07/2018 17:42   Ct Head Wo Contrast  Result Date: 10/08/2018 CLINICAL DATA:  Muscle weakness EXAM: CT HEAD WITHOUT CONTRAST TECHNIQUE: Contiguous axial images were obtained from the base of the skull through the vertex without intravenous contrast. COMPARISON:  10/17/2016 FINDINGS: Brain: No evidence of acute infarction, hemorrhage, extra-axial collection, ventriculomegaly, or mass effect. Generalized cerebral atrophy. Periventricular white matter low attenuation likely secondary to microangiopathy. Vascular: Cerebrovascular atherosclerotic calcifications are noted. Skull: Negative for fracture or focal lesion. Sinuses/Orbits: Visualized portions of the orbits are unremarkable. Visualized portions of the paranasal sinuses and mastoid air cells are unremarkable. Other: None. IMPRESSION: No acute intracranial pathology. Electronically Signed   By: Kathreen Devoid   On: 10/08/2018 02:27   Ct Abdomen Pelvis W Contrast  Result Date: 10/07/2018 CLINICAL DATA:  85 year old female with weakness for 2 weeks. Frequent falls. EXAM: CT ABDOMEN AND PELVIS WITH CONTRAST TECHNIQUE: Multidetector CT imaging of the abdomen and pelvis was performed using the standard protocol following bolus administration of intravenous contrast. CONTRAST:  88mL ISOVUE-300 IOPAMIDOL (ISOVUE-300) INJECTION 61% COMPARISON:  CT Abdomen and Pelvis 11/05/2015 and earlier. FINDINGS: Lower chest: Negative; small fat containing Bochdalek's hernia (normal variant). Hepatobiliary: Negative liver and gallbladder. Pancreas: Negative aside from chronic pancreatic atrophy. Spleen: Negative. Adrenals/Urinary Tract: Normal adrenal glands. Chronic benign right renal cysts. Tiny left renal cysts. Symmetric renal enhancement and contrast excretion. Decompressed proximal ureters. Diminutive and unremarkable urinary bladder. Stomach/Bowel: Mild distension of the rectum by gas and stool. Decompressed  distal sigmoid. Mildly redundant and gas-filled proximal sigmoid. Similar gas in the descending and transverse colon segments which are mildly redundant. Negative right colon. Occasional large bowel diverticula but no active inflammation. Negative terminal ileum. Diminutive or absent appendix. No dilated small bowel. Negative stomach and duodenum. No free air, free fluid. Vascular/Lymphatic: Mild Aortoiliac calcified atherosclerosis. Major arterial structures in the abdomen and pelvis are patent. Portal venous system is patent. No lymphadenopathy. Reproductive: Surgically absent uterus with diminutive or absent ovaries. Other: No pelvic free fluid. Small fat containing left inguinal hernia is stable. Musculoskeletal: Osteopenia. Chronic proximal left femur ORIF. No acute osseous abnormality identified. IMPRESSION: Stable since 2017. No acute or inflammatory process in the abdomen or pelvis. Electronically Signed   By: Genevie Ann M.D.   On: 10/07/2018 20:36    ASSESSMENT AND PLAN:  This is a 85 y.o. female with a history of Parkinson's, hypertension, hyperlipidemia, diabetes, anxiety and depression now being admitted with acute fall  *Acute on chronic weakness, deconditioning, adult failure to thrive  Compounded by history of Parkinson's disease-not on medications  , Physical therapy recommending skilled nursing facility placement, OT/speech therapy/dietary to see, aspiration/fall/skin care precautions while in house, neurology consulted for expert opinion, CT head negative for any acute process   *Acute hypokalemia  Repleted   *Acute hypomagnesemia  Replete with IV magnesium, check level in the morning   *Chronic diabetes mellitus type 2  Slight scale insulin Accu-Cheks per routine   *History of Parkinson's disease Now on medications Plan of care as stated above  *Chronic moderate to severe protein energy/calorie malnutrition Dietary to see  DVT prophylaxis-Lovenox subcu Disposition to  skilled nursing facility status post discharge in 1 to 2 days barring any complications   All the records are reviewed and case discussed with Care Management/Social Workerr. Management plans discussed  with the patient, family and they are in agreement.  CODE STATUS: full TOTAL TIME TAKING CARE OF THIS PATIENT: 40 minutes.     POSSIBLE D/C IN 1-2 DAYS, DEPENDING ON CLINICAL CONDITION.   Avel Peace Salary M.D on 10/08/2018   Between 7am to 6pm - Pager - [phone]  After 6pm go to [email or web address] - password [email or web address] [facility]  Office  [phone]  CC: Primary care physician; Remi Haggard, FNP  Note: This dictation was prepared with Dragon dictation along with smaller phrase technology. Any transcriptional errors that result from this process are unintentional.

## 2018-10-08 NOTE — ED Notes (Signed)
Ambulated with 2 assist to commode.  Tolerated well.  Bath done.  Peri care done.  Linen changed.  Patient repositioned.

## 2018-10-08 NOTE — ED Notes (Signed)
Report given to 1C

## 2018-10-08 NOTE — ED Notes (Signed)
Changed pt diaper

## 2018-10-08 NOTE — ED Notes (Signed)
Call back to 1C

## 2018-10-08 NOTE — ED Notes (Signed)
PT is in with pt

## 2018-10-08 NOTE — Progress Notes (Signed)
SLP Cancellation Note  Patient Details Name: Angela Savage MRN: [id number] DOB: 01/15/1932   Cancelled treatment:       Reason Eval/Treat Not Completed: (chart reviewed; pt not currently in the room yet). ST services will f/u tomorrow. NSG agreed.     Orinda Kenner, MS, CCC-SLP Watson,Katherine 10/08/2018, 4:56 PM

## 2018-10-08 NOTE — ED Notes (Signed)
admitting Provider at bedside. 

## 2018-10-08 NOTE — Evaluation (Signed)
Physical Therapy Evaluation Patient Details Name: Angela Savage MRN: [id number] DOB: 02-Mar-1932 Today's Date: 10/08/2018   History of Present Illness   85 y.o. female with a known history of Parkinson's, hypertension, hyperlipidemia, diabetes, anxiety and depression presents to the emergency department for weakness status post fall.  Clinical Impression  Pt laying in bed in NAD on arrival, daughter present.  Pt reports that she is typically able to get around in the home with 4WW, but does admit that this has been getting harder with essentially daily falls and progressing weakness that is making safety considerations at home more challenging.  She needed assist to get to/from sitting and standing and between tremors (Parkinsonian) and very kyphotic posture getting to and maintaining standing were difficult.  She was able to take a few very small, shuffling, unsure steps near EOB but ultimately did not show ability to do any real gait/mobility safely.      Follow Up Recommendations SNF    Equipment Recommendations  (she mostly uses 4WW, she may have (more appropriate) FWW)    Recommendations for Other Services       Precautions / Restrictions Precautions Precautions: Fall Restrictions Weight Bearing Restrictions: No      Mobility  Bed Mobility Overal bed mobility: Needs Assistance Bed Mobility: Supine to Sit;Sit to Supine     Supine to sit: Mod assist Sit to supine: Mod assist   General bed mobility comments: attempted to let pt get herself to/from sitting, she was ultimately unable and needed assist  Transfers Overall transfer level: Needs assistance Equipment used: Rolling walker (2 wheeled) Transfers: Sit to/from Stand Sit to Stand: Min assist         General transfer comment: Pt needed reminders for hand placement and set up, she seemed confident in getting to standing but ultimately did need assist to rise and maintain balance.  Ambulation/Gait Ambulation/Gait  assistance: Min assist Gait Distance (Feet): 4 Feet Assistive device: Rolling walker (2 wheeled)       General Gait Details: Pt was able to take a few side shuffle steps along EOB and then a few forward and backward steps. She was very stooped, showed poor confidence and very abbreviated/shuffling step length.  Reliant on the walker and unable to safely do any more before needing to sit.  Stairs            Wheelchair Mobility    Modified Rankin (Stroke Patients Only)       Balance Overall balance assessment: Needs assistance Sitting-balance support: Bilateral upper extremity supported Sitting balance-Leahy Scale: Fair Sitting balance - Comments: pt leaning to the R, showed good effort but poor execution in holding sitting balance     Standing balance-Leahy Scale: Fair Standing balance comment: highly reliant on walker, poor/stooped posture                             Pertinent Vitals/Pain Pain Assessment: (no c/o acute pain, vague ache/chronic pain)    Home Living Family/patient expects to be discharged to:: Skilled nursing facility                 Additional Comments: Pt has life alert and assistance from aide/family daily    Prior Function Level of Independence: Needs assistance   Gait / Transfers Assistance Needed: uses 4WW in/out of home, pt reports she has fallen daily for at least the last week and has had many, many falls in general  Hand Dominance        Extremity/Trunk Assessment   Upper Extremity Assessment Upper Extremity Assessment: Generalized weakness    Lower Extremity Assessment Lower Extremity Assessment: Generalized weakness       Communication   Communication: No difficulties  Cognition Arousal/Alertness: Awake/alert Behavior During Therapy: Anxious Overall Cognitive Status: Difficult to assess(per daughter pt appears near her baseline)                                 General Comments:  Pt displays some general confusion but was self and situationally aware      General Comments      Exercises     Assessment/Plan    PT Assessment Patient needs continued PT services  PT Problem List Decreased strength;Decreased activity tolerance;Decreased range of motion;Decreased balance;Decreased mobility;Decreased coordination;Decreased cognition;Decreased knowledge of use of DME;Decreased safety awareness;Pain       PT Treatment Interventions DME instruction;Gait training;Stair training;Functional mobility training;Therapeutic activities;Therapeutic exercise;Balance training;Cognitive remediation;Neuromuscular re-education;Patient/family education    PT Goals (Current goals can be found in the Care Plan section)  Acute Rehab PT Goals Patient Stated Goal: get stronger at rehab PT Goal Formulation: With patient Time For Goal Achievement: 10/22/18 Potential to Achieve Goals: Fair    Frequency Min 2X/week   Barriers to discharge        Co-evaluation               AM-PAC PT "6 Clicks" Mobility  Outcome Measure Help needed turning from your back to your side while in a flat bed without using bedrails?: A Little Help needed moving from lying on your back to sitting on the side of a flat bed without using bedrails?: A Little Help needed moving to and from a bed to a chair (including a wheelchair)?: A Little Help needed standing up from a chair using your arms (e.g., wheelchair or bedside chair)?: A Little Help needed to walk in hospital room?: A Lot Help needed climbing 3-5 steps with a railing? : Total 6 Click Score: 15    End of Session Equipment Utilized During Treatment: Gait belt Activity Tolerance: Patient limited by fatigue Patient left: in bed;with call bell/phone within reach;with family/visitor present   PT Visit Diagnosis: Muscle weakness (generalized) (M62.81);Difficulty in walking, not elsewhere classified (R26.2);Unsteadiness on feet (R26.81);Repeated  falls (R29.6)    Time: 3474-2595 PT Time Calculation (min) (ACUTE ONLY): 25 min   Charges:   PT Evaluation $PT Eval Low Complexity: 1 Low          Kreg Shropshire, DPT 10/08/2018, 1:16 PM

## 2018-10-08 NOTE — ED Notes (Signed)
Patient transported to CT 

## 2018-10-08 NOTE — Progress Notes (Signed)
OT Cancellation Note  Patient Details Name: Angela Savage MRN: [id number] DOB: May 19, 1932   Cancelled Treatment:    Reason Eval/Treat Not Completed: Other (comment). Consult received, chart reviewed. Spoke with RN who indicated pt preparing for transfer from ED up to the floor within the next 30 minutes. Will hold OT evaluation this date and re-attempt in the morning once pt is up on the floor and medically appropriate.   Jeni Salles, MPH, MS, OTR/L ascom [phone] 10/08/18, 4:26 PM

## 2018-10-08 NOTE — ED Notes (Signed)
Call to 1C.  Room is still being cleaned.  Staff request that I call back

## 2018-10-09 DIAGNOSIS — R531 Weakness: Secondary | ICD-10-CM | POA: Diagnosis not present

## 2018-10-09 LAB — GLUCOSE, CAPILLARY
Glucose-Capillary: 156 mg/dL — ABNORMAL HIGH (ref 70–99)
Glucose-Capillary: 190 mg/dL — ABNORMAL HIGH (ref 70–99)
Glucose-Capillary: 194 mg/dL — ABNORMAL HIGH (ref 70–99)
Glucose-Capillary: 138 mg/dL — ABNORMAL HIGH (ref 70–99)

## 2018-10-09 MED ORDER — ALUM HYDROXIDE-MAG TRISILICATE 80-20 MG PO CHEW: 1.0000 | CHEWABLE_TABLET | Freq: Every day | ORAL | Status: DC

## 2018-10-09 MED ORDER — ADULT MULTIVITAMIN W/MINERALS CH
1.0000 | ORAL_TABLET | Freq: Every day | ORAL | Status: DC
  Administered 2018-10-09 – 2018-10-10 (×2): 1 via ORAL
  Filled 2018-10-09 (×2): qty 1

## 2018-10-09 MED ORDER — TRAMADOL HCL 50 MG PO TABS: 50.0000 mg | ORAL_TABLET | Freq: Four times a day (QID) | ORAL | Status: DC | PRN

## 2018-10-09 MED ORDER — ALPRAZOLAM 0.5 MG PO TABS
0.5000 mg | ORAL_TABLET | Freq: Four times a day (QID) | ORAL | Status: DC
  Administered 2018-10-09 – 2018-10-10 (×5): 0.5 mg via ORAL
  Filled 2018-10-09 (×5): qty 1

## 2018-10-09 MED ORDER — TRAMADOL HCL 50 MG PO TABS: 50.0000 mg | ORAL_TABLET | Freq: Three times a day (TID) | ORAL | Status: DC

## 2018-10-09 MED ORDER — BISMUTH SUBSALICYLATE 262 MG/15ML PO SUSP
30.0000 mL | Freq: Every morning | ORAL | Status: DC
  Administered 2018-10-10: 30 mL via ORAL
  Filled 2018-10-09: qty 118

## 2018-10-09 MED ORDER — ALUM & MAG HYDROXIDE-SIMETH 200-200-20 MG/5ML PO SUSP
15.0000 mL | Freq: Every day | ORAL | Status: DC
  Administered 2018-10-09: 21:00:00 15 mL via ORAL
  Filled 2018-10-09: qty 30

## 2018-10-09 MED ORDER — ENSURE ENLIVE PO LIQD
237.0000 mL | Freq: Two times a day (BID) | ORAL | Status: DC
  Administered 2018-10-10: 237 mL via ORAL

## 2018-10-09 NOTE — NC FL2 (Signed)
[place] LEVEL OF CARE SCREENING TOOL     IDENTIFICATION  Patient Name: Angela Savage Birthdate: [birth year] Sex: female Admission Date (Current Location): 10/07/2018  [place] and [place] Number:  Engineering geologist and Address:  [facility], [address], [place], [place] [address]      Provider Number: [id number]  Attending Physician Name and Address:  Gorden Harms, MD  Relative Name and Phone Number:       Current Level of Care: Hospital Recommended Level of Care: [place] Prior Approval Number:    Date Approved/Denied:   PASRR Number: [id number]  Discharge Plan: SNF    Current Diagnoses: Patient Active Problem List   Diagnosis Date Noted  . Weakness 10/07/2018  . Closed intertrochanteric fracture ([place]) 01/15/2018  . Disorder of bursae of shoulder region 01/15/2018  . Osteoarthritis of knee 01/15/2018  . History of breast cancer in female 12/10/2017  . HTN (hypertension) 09/21/2017  . Elevated hemoglobin A1c 09/21/2017  . 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 Hx of fracture of left hip 12/06/2015  . Benign essential hypertension 12/06/2015  . Type 2 diabetes mellitus without complication, without long-term current use of insulin ([place]) 12/06/2015  . Chronic UTI 10/27/2015  . Gross hematuria 10/27/2015  . Atrophic vaginitis 10/27/2015  . Dysfunctional gallbladder 07/06/2015  . CN (constipation) 01/06/2015  . H/O disease 01/06/2015  . Abdominal pain, left lower quadrant 01/06/2015  . Abdominal pain, right upper quadrant 01/06/2015  . H/O acute pancreatitis 01/06/2015    Orientation RESPIRATION BLADDER Height & Weight     Self, Time, Place  Normal Incontinent Weight: 120 lb (54.4 kg) Height:  5\' 3"  (160 cm)  BEHAVIORAL SYMPTOMS/MOOD NEUROLOGICAL BOWEL NUTRITION STATUS  (none) (none) Continent Diet(Dysphagia 3 )  AMBULATORY STATUS COMMUNICATION OF NEEDS Skin   Extensive Assist Verbally  Normal                       Personal Care Assistance Level of Assistance  Bathing, Feeding, Dressing Bathing Assistance: Limited assistance Feeding assistance: Independent Dressing Assistance: Limited assistance     Functional Limitations Info  Sight, Hearing, Speech Sight Info: Adequate Hearing Info: Adequate Speech Info: Adequate    SPECIAL CARE FACTORS FREQUENCY  PT (By licensed PT), OT (By licensed OT)     PT Frequency: 5 OT Frequency: 5            Contractures Contractures Info: Not present    Additional Factors Info  Code Status, Allergies Code Status Info: Full Code  Allergies Info: Avandia Rosiglitazone Maleate, Carbidopa-levodopa, Ciprofloxacin, Codeine, Glucovance Glyburide-metformin, Rosiglitazone           Current Medications (10/09/2018):  This is the current hospital active medication list Current Facility-Administered Medications  Medication Dose Route Frequency Provider Last Rate Last Dose  . acetaminophen (TYLENOL) tablet 650 mg  650 mg Oral Q4H PRN Hugelmeyer, Alexis, DO   650 mg at 10/08/18 2125  . ALPRAZolam Duanne Moron) tablet 0.5 mg  0.5 mg Oral QID Salary, Montell D, MD   0.5 mg at 10/09/18 1222  . alum & mag hydroxide-simeth (MAALOX/MYLANTA) 200-200-20 MG/5ML suspension 15 mL  15 mL Oral QHS Salary, Montell D, MD      . aspirin chewable tablet 81 mg  81 mg Oral Daily Hugelmeyer, Alexis, DO   81 mg at 10/09/18 5621  . bisacodyl (DULCOLAX) EC tablet 5 mg  5 mg Oral Daily PRN Hugelmeyer, Alexis, DO      .  bismuth subsalicylate (PEPTO BISMOL) 262 MG/15ML suspension 30 mL  30 mL Oral q morning - 10a Salary, Montell D, MD      . enoxaparin (LOVENOX) injection 40 mg  40 mg Subcutaneous Q24H Hugelmeyer, Alexis, DO   40 mg at 10/08/18 2039  . fluticasone (FLONASE) 50 MCG/ACT nasal spray 2 spray  2 spray Each Nare Daily Hugelmeyer, Alexis, DO      . hydrochlorothiazide (HYDRODIURIL) tablet 25 mg  25 mg Oral BH-q7a Hugelmeyer, Alexis, DO   25 mg at  10/09/18 0820  . insulin aspart (novoLOG) injection 0-15 Units  0-15 Units Subcutaneous TID WC Hugelmeyer, Alexis, DO   3 Units at 10/09/18 1222  . insulin aspart (novoLOG) injection 0-5 Units  0-5 Units Subcutaneous QHS Hugelmeyer, Alexis, DO      . insulin glargine (LANTUS) injection 14 Units  14 Units Subcutaneous Daily Hugelmeyer, Alexis, DO   14 Units at 10/08/18 1251  . magnesium citrate solution 1 Bottle  1 Bottle Oral Once PRN Hugelmeyer, Alexis, DO      . metoprolol succinate (TOPROL-XL) 24 hr tablet 50 mg  50 mg Oral Daily Hugelmeyer, Alexis, DO   50 mg at 10/09/18 1157  . ondansetron (ZOFRAN) tablet 4 mg  4 mg Oral Q6H PRN Hugelmeyer, Alexis, DO       Or  . ondansetron (ZOFRAN) injection 4 mg  4 mg Intravenous Q6H PRN Hugelmeyer, Alexis, DO      . potassium chloride SA (K-DUR,KLOR-CON) CR tablet 20 mEq  20 mEq Oral Daily Hugelmeyer, Alexis, DO   20 mEq at 10/09/18 0820  . senna-docusate (Senokot-S) tablet 1 tablet  1 tablet Oral QHS PRN Hugelmeyer, Alexis, DO      . traMADol (ULTRAM) tablet 50 mg  50 mg Oral Q6H PRN Salary, Avel Peace, MD         Discharge Medications: Please see discharge summary for a list of discharge medications.  Relevant Imaging Results:  Relevant Lab Results:   Additional Information SSN: [id number]  Annamaria Boots, [place]

## 2018-10-09 NOTE — Evaluation (Signed)
Occupational Therapy Evaluation Patient Details Name: Angela Savage MRN: [id number] DOB: [birth year] Today's Date: 10/09/2018    History of Present Illness  85 y.o. female with a known history of Parkinson's, hypertension, hyperlipidemia, diabetes, anxiety and depression presents to the emergency department for weakness status post fall.   Clinical Impression   Pt seen for OT evaluation this date. Prior to hospital admission, pt was living alone with PRN assist from family and ambulating with rollator. Pt reports increased weakness and falls over the past week requiring increased assist from family for ADL and IADL. Pt presents with significant BUE tremors at rest and during intentional tasks. Pt reporting these tremors are more intense than her baseline. Pt also demonstrates impairments in balance, strength, activity tolerance, and perseverates on topic of medication she is getting in the hospital. Pt requested to speak with RN regarding medications; OT notified RN at end of session. Current impairments impact pt's ability to perform functional mobility and ADL, requiring increased assist. Pt would benefit from skilled OT to address noted impairments and functional limitations (see below for any additional details) in order to maximize safety and independence while minimizing falls risk and caregiver burden.  Upon hospital discharge, recommend pt discharge to [facility].    Follow Up Recommendations  SNF    Equipment Recommendations  Other (comment)(TBD)    Recommendations for Other Services       Precautions / Restrictions Precautions Precautions: Fall Restrictions Weight Bearing Restrictions: No      Mobility Bed Mobility Overal bed mobility: Needs Assistance Bed Mobility: Supine to Sit;Sit to Supine     Supine to sit: Mod assist Sit to supine: Mod assist      Transfers  Transfers Comment: Deferred 2/2 BUE tremors and pt "recovering from medication reaction this morning". RN  aware                    Balance Overall balance assessment: Needs assistance Sitting-balance support: Bilateral upper extremity supported Sitting balance-Leahy Scale: Fair                                     ADL either performed or assessed with clinical judgement   ADL Overall ADL's : Needs assistance/impaired Eating/Feeding: Bed level;Minimal assistance   Grooming: Wash/dry face;Set up;Bed level                                 General ADL Comments: MOD A for bathing, dressing, and Min A for toilet transfer 2/2 tremors (which per pt are worse than baseline)     Vision Baseline Vision/History: Wears glasses Wears Glasses: At all times Patient Visual Report: No change from baseline       Perception     Praxis      Pertinent Vitals/Pain Pain Assessment: (mild chronic lower back pain)     Hand Dominance Right   Extremity/Trunk Assessment Upper Extremity Assessment Upper Extremity Assessment: Generalized weakness(significant BUE tremors - resting and intentional)   Lower Extremity Assessment Lower Extremity Assessment: Generalized weakness   Cervical / Trunk Assessment Cervical / Trunk Assessment: Kyphotic   Communication Communication Communication: No difficulties   Cognition Arousal/Alertness: Awake/alert Behavior During Therapy: Anxious Overall Cognitive Status: No family/caregiver present to determine baseline cognitive functioning  General Comments: pt perseverating on medications requiring cues to redirect, follows commands, alert and oriented   General Comments       Exercises Other Exercises Other Exercises: pt instructed in weighted utensils and other AE to improve self feeding and minimize impact of BUE tremors; pt reports that the weighted utensils are too heavy and she prefers lightweight plastic utensils    Shoulder Instructions      Home Living Family/patient  expects to be discharged to:: Skilled nursing facility                                 Additional Comments: Pt has life alert and assistance from aide/family daily      Prior Functioning/Environment Level of Independence: Needs assistance  Gait / Transfers Assistance Needed: uses 4WW in/out of home, pt reports she has fallen daily for at least the last week and has had many, many falls in general ADL's / Homemaking Assistance Needed: pt reports using plastic short utensils for self feeding which work well for her when she has tremors, does not drive, but otherwise fairly independent; son sets up weekly pill box            OT Problem List: Decreased strength;Decreased range of motion;Decreased coordination;Decreased knowledge of use of DME or AE;Impaired UE functional use;Impaired balance (sitting and/or standing);Pain      OT Treatment/Interventions: Self-care/ADL training;Balance training;Therapeutic exercise;Therapeutic activities;DME and/or AE instruction;Patient/family education    OT Goals(Current goals can be found in the care plan section) Acute Rehab OT Goals Patient Stated Goal: get stronger at rehab OT Goal Formulation: With patient Time For Goal Achievement: 10/23/18 Potential to Achieve Goals: Good ADL Goals Pt Will Perform Eating: with set-up;with adaptive utensils;sitting Pt Will Perform Lower Body Dressing: with min guard assist;sit to/from stand Pt Will Transfer to Toilet: with min guard assist;ambulating(LRAD for amb)  OT Frequency: Min 1X/week   Barriers to D/C:            Co-evaluation              AM-PAC OT "6 Clicks" Daily Activity     Outcome Measure Help from another person eating meals?: A Little Help from another person taking care of personal grooming?: A Little Help from another person toileting, which includes using toliet, bedpan, or urinal?: A Little Help from another person bathing (including washing, rinsing, drying)?: A  Lot Help from another person to put on and taking off regular upper body clothing?: A Lot Help from another person to put on and taking off regular lower body clothing?: A Lot 6 Click Score: 15   End of Session Nurse Communication: Other (comment)(pt asked to speak with RN re: medications)  Activity Tolerance: Patient tolerated treatment well Patient left: in bed;with call bell/phone within reach;with bed alarm set  OT Visit Diagnosis: Other abnormalities of gait and mobility (R26.89);Feeding difficulties (R63.3);Repeated falls (R29.6);Muscle weakness (generalized) (M62.81)                Time: 1552-0802 OT Time Calculation (min): 27 min Charges:  OT General Charges $OT Visit: 1 Visit OT Evaluation $OT Eval Moderate Complexity: 1 Mod OT Treatments $Self Care/Home Management : 8-22 mins  Jeni Salles, MPH, MS, OTR/L ascom [phone] 10/09/18, 12:08 PM

## 2018-10-09 NOTE — Progress Notes (Signed)
PT Cancellation Note  Patient Details Name: Angela Savage MRN: [id number] DOB: 11/09/1931   Cancelled Treatment:    Reason Eval/Treat Not Completed: Medical issues which prohibited therapy. Treatment attempted, nursing with pt. Pt/nursing reports pt tremors are worsening and constant at this time making mobility unsafe. Pt also complaining of feeling of hot/cold sweats. Nursing addressing medication. Request holding PT until tomorrow.    Larae Grooms, PTA 10/09/2018, 12:00 PM

## 2018-10-09 NOTE — Progress Notes (Deleted)
Discussed with the patient's 2 daughters-1 in person and the other via phone, they want her pain medicine to be scheduled and not as needed, they wanted orthopedic surgeon to call them with updates, will comply

## 2018-10-09 NOTE — Progress Notes (Signed)
Called into room by patient. She reports her shaking is worse and she is requesting more xanax. Patient reports that she takes her xanax 4 times a day at home. She states when she has episodes where her shaking is worse or she breaks out in sweats she may take more. Daughter at bedside. She is unaware of how much xanax patient takes. I informed patient of how her xanax was ordered. Per her med rec she is prescribed 1mg  bid. Our current order is 0.5mg  bid. Called patient home pharmacy to confirm dose and it was confirmed that she is prescribed 1mg  bid. Patient states she breaks 2 pills in half and takes them 4 times a day instead of 1mg  at a time. Notified MD who stated we could order it per her home schedule of 4 times a day. Patient notified that order will be changed. Patient then asked that if she kept having the shakes and sweats could she take a whole 1mg  xanax because she did that at home sometimes. I informed that patient that we could only give the xanax as prescribed 4 times a day. Family remains at bedside to attempt to comfort patient and ease her anxiety.

## 2018-10-09 NOTE — Consult Note (Signed)
NEUROLOGY CONSULT  Reason for Consult:Parkinson disease management  Referring Physician: Dr. Jerelyn Charles   CC: generalized weakness and tremor   HPI: Angela Savage is an 85 y.o. female with past medical history significant for Parkinson disease not on medication, hypertension, hyperlipidemia, diabetes, anxiety and depression who presents emergency room on October 07, 2018 status post fall.  Patient stated that for the last couple of weeks she has been having some difficulty ambulating, she had multiple multiple falls with no head trauma, she said that she feels weak all over and it is becoming difficult for her to do her daily activities, patient denied any focal motor weakness, she denies any visual changes, she denies any chest pain or shortness of breath, she denied any loss of consciousness or incontinence of the bladder or stools, she denies any nausea or vomiting. In the emergency room patient had a CT scan of the brain which shows no acute intracranial abnormality, she was found to have hypo-kalemia and hypo-magnesium, she was admitted to the hospital for further managements. Patient has been diagnosed with Parkinson disease, she follows up with outpatient neurology, she stated that she was started at some point on Parkinson medications (sentiment) Pt stated that she was taken off the medication after 1 week of starting due to side effect including diarrhea and blood in her stool. Patient takes alprazolam for anxiety, she stated that the only medication that helps her.  Past Medical History Past Medical History:  Diagnosis Date  . Allergy   . Anemia   . Anxiety and depression   . Breast cancer ([place])   . Cataracts, bilateral   . Diabetes mellitus   . Diverticulitis   . Endometrial cancer ([place])   . GERD (gastroesophageal reflux disease)   . Hyperlipidemia   . Hypertension   . Parkinson's disease ([place])   . Peptic ulcer   . Tremor   . Umbilical hernia     Past Surgical History Past  Surgical History:  Procedure Laterality Date  . [place]   right, after lumpectomy  . Bladder operation     Tumor ?  Marland Kitchen BREAST LUMPECTOMY  1997   right  . COLONOSCOPY  2008   Internal hemorrhoids, Dr. Vira Agar  . TOTAL ABDOMINAL HYSTERECTOMY W/ BILATERAL SALPINGOOPHORECTOMY  1955   malignant tumor  . UMBILICAL HERNIA REPAIR  2007    Family History Family History  Problem Relation Age of Onset  . Colon cancer Maternal Aunt   . Breast cancer Daughter        sisters x3  . Ovarian cancer Sister   . Diabetes Father        children  . Stroke Mother   . Kidney disease Neg Hx   . Bladder Cancer Neg Hx   . Colon polyps Neg Hx     Social History    reports that she has never smoked. She has never used smokeless tobacco. She reports that she does not drink alcohol or use drugs.  Allergies Allergies  Allergen Reactions  . Avandia [Rosiglitazone Maleate] Other (See Comments)    Rapid heart rate  . Carbidopa-Levodopa   . Ciprofloxacin     Heart problems   . Codeine   . Glucovance [Glyburide-Metformin] Other (See Comments)    Rapid heart beat  . Rosiglitazone Other (See Comments) and Palpitations    Chest pain.    Home Medications Facility-Administered Medications Prior to Admission  Medication Dose Route Frequency Provider Last Rate Last Dose  .  0.9 %  sodium chloride infusion  500 mL Intravenous Once Gatha Mayer, MD       Medications Prior to Admission  Medication Sig Dispense Refill  . acetaminophen (TYLENOL) 325 MG tablet Take 650 mg by mouth every 4 (four) hours as needed.     . ALPRAZolam (XANAX) 1 MG tablet Take 1 mg by mouth 2 (two) times daily.     . Alum Hydroxide-Mag Carbonate (GAVISCON PO) Take 1 tablet by mouth 3 (three) times daily before meals.    . bismuth subsalicylate (PEPTO BISMOL) 262 MG/15ML suspension Take by mouth as needed.    . Cholecalciferol (VITAMIN D3) 5000 units CAPS Take by mouth. Take 1 capsule 3 times a week     . fluticasone (FLONASE) 50 MCG/ACT nasal spray Place 2 sprays into both nostrils daily.     . hydrochlorothiazide 25 MG tablet Take 25 mg by mouth every morning.      Marland Kitchen LANTUS SOLOSTAR 100 UNIT/ML Solostar Pen Inject 14 Units into the skin daily at 12 noon.     . metFORMIN (GLUCOPHAGE) 1000 MG tablet Take 1,000 mg by mouth 2 (two) times daily with a meal.      . metoprolol succinate (TOPROL-XL) 50 MG 24 hr tablet     . potassium chloride SA (K-DUR,KLOR-CON) 20 MEQ tablet Take 20 mEq by mouth daily.     . Pumpkin Seed-Soy Germ (AZO BLADDER CONTROL/GO-LESS PO) Take 1 tablet by mouth 2 (two) times a week.     Marland Kitchen aspirin 81 MG tablet Take 81 mg by mouth daily.    Marland Kitchen glucose blood test strip Accu-Chek Compact Test strips    . pramipexole (MIRAPEX) 1.5 MG tablet Take 1.5 mg by mouth daily.       Hospital Medications . ALPRAZolam  0.5 mg Oral QID  . alum hydroxide-mag trisilicate  1 tablet Oral QHS  . aspirin  81 mg Oral Daily  . bismuth subsalicylate  30 mL Oral q morning - 10a  . enoxaparin (LOVENOX) injection  40 mg Subcutaneous Q24H  . fluticasone  2 spray Each Nare Daily  . hydrochlorothiazide  25 mg Oral BH-q7a  . insulin aspart  0-15 Units Subcutaneous TID WC  . insulin aspart  0-5 Units Subcutaneous QHS  . insulin glargine  14 Units Subcutaneous Daily  . metoprolol succinate  50 mg Oral Daily  . potassium chloride SA  20 mEq Oral Daily     ROS:  Negative except of what is reported in HPI    Physical Examination:  Vitals:   10/09/18 0600 10/09/18 0601 10/09/18 0817 10/09/18 0938  BP: 121/78  (!) 144/74 132/74  Pulse: 74 74 88 92  Resp: 18   20  Temp: 98.4 F (36.9 C)  98.2 F (36.8 C) 98.1 F (36.7 C)  TempSrc: Oral  Oral Oral  SpO2: 91% 92% 93% 95%  Weight:      Height:        General - NAD, B/L resting tremor  Heart - Regular rate and rhythm - no murmer Lungs - Clear to auscultation Abdomen - Soft - non tender Extremities - Distal pulses intact - no edema Skin  - Warm and dry  Neurologic Examination:   Mental Status:  Alert, oriented to self and place, Speech without evidence of dysarthria or aphasia. Able to follow 3 step commands without difficulty.  bilateral visual fields intact III/IV/VI-Pupils were equal and reacted. Extraocular movements were full.  no facial numbness and no facial  weakness.  midline tongue extension  Motor: 4/5 throughout Increased muscle tone with rigidity, resting tremors B/L R>L Sensory: Intact to light touch in all extremities. Deep Tendon Reflexes: 1/4 throughout Cerebellar: Normal finger to nose and heel to shin bilaterally. Gait: not tested   LABORATORY STUDIES:  Basic Metabolic Panel: Recent Labs  Lab 10/07/18 1706 10/08/18 0117 10/08/18 0509  NA 138  --  138  K 3.4*  --  3.5  CL 96*  --  98  CO2 32  --  32  GLUCOSE 166*  --  154*  BUN 20  --  15  CREATININE 0.74  --  0.58  CALCIUM 9.8  --  9.2  MG  --  1.6*  --   PHOS  --  3.4  --     Liver Function Tests: Recent Labs  Lab 10/08/18 0509  AST 25  ALT 15  ALKPHOS 36*  BILITOT 1.2  PROT 7.0  ALBUMIN 3.8   No results for input(s): LIPASE, AMYLASE in the last 168 hours. No results for input(s): AMMONIA in the last 168 hours.  CBC: Recent Labs  Lab 10/07/18 1706 10/08/18 0509  WBC 11.3* 9.8  HGB 12.2 11.5*  HCT 38.7 36.2  MCV 88.2 87.4  PLT 388 304    Cardiac Enzymes: Recent Labs  Lab 10/07/18 1706 10/08/18 0117 10/08/18 0509 10/08/18 1545  TROPONINI <0.03 <0.03 <0.03 <0.03    BNP: Invalid input(s): POCBNP  CBG: Recent Labs  Lab 10/08/18 1708 10/08/18 2224 10/09/18 0809 10/09/18 0935 10/09/18 1204  GLUCAP 131* 177* 156* 190* 194*    Microbiology:   Coagulation Studies: No results for input(s): LABPROT, INR in the last 72 hours.  Urinalysis:  Recent Labs  Lab 10/07/18 1849  COLORURINE YELLOW*  LABSPEC 1.012  PHURINE 5.0  GLUCOSEU NEGATIVE  HGBUR SMALL*  BILIRUBINUR NEGATIVE  KETONESUR NEGATIVE   PROTEINUR NEGATIVE  NITRITE NEGATIVE  LEUKOCYTESUR NEGATIVE    Lipid Panel:  No results found for: CHOL, TRIG, HDL, CHOLHDL, VLDL, LDLCALC  HgbA1C:  Lab Results  Component Value Date   HGBA1C 6.9 (H) 10/08/2018    Urine Drug Screen:  No results found for: LABOPIA, COCAINSCRNUR, LABBENZ, AMPHETMU, THCU, LABBARB   Alcohol Level:  No results for input(s): ETH in the last 168 hours.  Miscellaneous labs:  EKG  EKG  IMAGING: Dg Chest 2 View  Result Date: 10/07/2018 CLINICAL DATA:  Generalized weakness for the past 2 weeks. Diffuse myalgias. EXAM: CHEST - 2 VIEW COMPARISON:  03/19/2014. FINDINGS: Normal sized heart. Tortuous and partially calcified thoracic aorta. The lungs remain clear with stable mild hyperexpansion of the lungs and mild diffuse peribronchial thickening and accentuation of the interstitial markings. Stable left diaphragmatic eventration and right axillary surgical clips. Diffuse osteopenia. IMPRESSION: No acute abnormality. Stable mild changes of COPD and chronic bronchitis. Electronically Signed   By: Claudie Revering M.D.   On: 10/07/2018 17:42   Ct Head Wo Contrast  Result Date: 10/08/2018 CLINICAL DATA:  Muscle weakness EXAM: CT HEAD WITHOUT CONTRAST TECHNIQUE: Contiguous axial images were obtained from the base of the skull through the vertex without intravenous contrast. COMPARISON:  10/17/2016 FINDINGS: Brain: No evidence of acute infarction, hemorrhage, extra-axial collection, ventriculomegaly, or mass effect. Generalized cerebral atrophy. Periventricular white matter low attenuation likely secondary to microangiopathy. Vascular: Cerebrovascular atherosclerotic calcifications are noted. Skull: Negative for fracture or focal lesion. Sinuses/Orbits: Visualized portions of the orbits are unremarkable. Visualized portions of the paranasal sinuses and mastoid air cells are unremarkable.  Other: None. IMPRESSION: No acute intracranial pathology. Electronically Signed   By:  Kathreen Devoid   On: 10/08/2018 02:27   Ct Abdomen Pelvis W Contrast  Result Date: 10/07/2018 CLINICAL DATA:  85 year old female with weakness for 2 weeks. Frequent falls. EXAM: CT ABDOMEN AND PELVIS WITH CONTRAST TECHNIQUE: Multidetector CT imaging of the abdomen and pelvis was performed using the standard protocol following bolus administration of intravenous contrast. CONTRAST:  38mL ISOVUE-300 IOPAMIDOL (ISOVUE-300) INJECTION 61% COMPARISON:  CT Abdomen and Pelvis 11/05/2015 and earlier. FINDINGS: Lower chest: Negative; small fat containing Bochdalek's hernia (normal variant). Hepatobiliary: Negative liver and gallbladder. Pancreas: Negative aside from chronic pancreatic atrophy. Spleen: Negative. Adrenals/Urinary Tract: Normal adrenal glands. Chronic benign right renal cysts. Tiny left renal cysts. Symmetric renal enhancement and contrast excretion. Decompressed proximal ureters. Diminutive and unremarkable urinary bladder. Stomach/Bowel: Mild distension of the rectum by gas and stool. Decompressed distal sigmoid. Mildly redundant and gas-filled proximal sigmoid. Similar gas in the descending and transverse colon segments which are mildly redundant. Negative right colon. Occasional large bowel diverticula but no active inflammation. Negative terminal ileum. Diminutive or absent appendix. No dilated small bowel. Negative stomach and duodenum. No free air, free fluid. Vascular/Lymphatic: Mild Aortoiliac calcified atherosclerosis. Major arterial structures in the abdomen and pelvis are patent. Portal venous system is patent. No lymphadenopathy. Reproductive: Surgically absent uterus with diminutive or absent ovaries. Other: No pelvic free fluid. Small fat containing left inguinal hernia is stable. Musculoskeletal: Osteopenia. Chronic proximal left femur ORIF. No acute osseous abnormality identified. IMPRESSION: Stable since 2017. No acute or inflammatory process in the abdomen or pelvis. Electronically Signed    By: Genevie Ann M.D.   On: 10/07/2018 20:36   Dg Foot 2 Views Right  Result Date: 10/09/2018 CLINICAL DATA:  Right foot pain. No reported injury. EXAM: RIGHT FOOT - 2 VIEW COMPARISON:  None. FINDINGS: Mild right hallux valgus deformity with degenerative changes in the first metatarsal-phalangeal joint. Diffuse bone demineralization. No evidence of acute fracture or dislocation. No focal bone lesion or bone destruction. Vascular calcifications in the soft tissues. IMPRESSION: Hallux valgus deformity with degenerative changes in the first metatarsal-phalangeal joint. No acute bony abnormalities. Electronically Signed   By: Lucienne Capers M.D.   On: 10/09/2018 00:46     Assessment/Plan:   85 years old female with past medical history significant for Parkinson disease not on anti-Parkinson medication due to side effect, hypertension, and anxiety who presented to the hospital with generalized weakness status post multiple falls and gait disturbance.  Most likely patient is having difficulty walking with deconditioning and Parkinson disease  I had a long discussion with the patient about starting Parkinson medication like Sinemet to help her with her Parkinson disease and slow the process of the disease, patient patient refused to take anti-Parkinson medication at this point due to her experience of having side effects from it, patient was advised to follow-up with her neurologist as an outpatient for Parkinson disease, at this point we recommend physical therapy and Occupational Therapy to continue with the general medical management per internal medicine team. Patient would benefit from MRI of the brain in an outpatient setting.  Arnaldo Natal, MD

## 2018-10-09 NOTE — Evaluation (Addendum)
Clinical/Bedside Swallow Evaluation Patient Details  Name: Angela Savage MRN: [id number] Date of Birth: February 06, 1932  Today's Date: 10/09/2018 Time: SLP Start Time (ACUTE ONLY): 0850 SLP Stop Time (ACUTE ONLY): 0935 SLP Time Calculation (min) (ACUTE ONLY): 45 min  Past Medical History:  Past Medical History:  Diagnosis Date  . Allergy   . Anemia   . Anxiety and depression   . Breast cancer ([place])   . Cataracts, bilateral   . Diabetes mellitus   . Diverticulitis   . Endometrial cancer ([place])   . GERD (gastroesophageal reflux disease)   . Hyperlipidemia   . Hypertension   . Parkinson's disease ([place])   . Peptic ulcer   . Tremor   . Umbilical hernia    Past Surgical History:  Past Surgical History:  Procedure Laterality Date  . [place]   right, after lumpectomy  . Bladder operation     Tumor ?  [facility] BREAST LUMPECTOMY  1997   right  . COLONOSCOPY  2008   Internal hemorrhoids, Dr. Vira Agar  . TOTAL ABDOMINAL HYSTERECTOMY W/ BILATERAL SALPINGOOPHORECTOMY  1955   malignant tumor  . UMBILICAL HERNIA REPAIR  2007   HPI:  Pt is a 85 y.o. female with a known history of Parkinson's, hypertension, hyperlipidemia, diabetes, anxiety and depression presents to the emergency department for evaluation of weakness status post fall.  Patient was in a usual state of health until 2 weeks ago when she describes the onset of aggressively worsening weakness, difficulty ambulating, requiring assistance with activities of daily living. States that she falls every day at home and wants to go to rehab to get stronger. She lives independently and uses a walker at home.    Assessment / Plan / Recommendation Clinical Impression  Pt presents w/ mild oral phase Dysphagia w/ mild increased risk for aspiration, though reduced when following general aspiration precautions. Pt was upright in bed, eating breakfast (scrambled eggs, toast, Ensure, coffee) w/ NSG student present upon ST  entry. Pt was self-feeding, however she seemed to be having a min difficult time getting food on the utensils d/t mod bilateral, UE tremors - Pt reported she prefers to self-feed. In addition, pt reported that she typically uses small and/or standard sized straws at home, as well as smaller, lighter utensils for easier eating, d/t tremor secondary to Parkinson's Disease. Pt uses both upper and lower dentures during meal times. Pt mentioned her son is going to get her re-sized for new dentures, as her current set has a slight wiggle to them. ST discussed options for denture adhesives.   During PO trials of solid foods, pt exhibits a munching pattern, rather than rotary chewing pattern. This seemed to have a min impact on pt's oral clearing, though she was able to clear bits of egg using strategy of alternating w/ coffee or Ensure. Pt demonstrated adequate lingual sweeping w/ ST verbal and visual cues. Pt demonstrated timely A-P transfer w/ no immediate overt s/s of aspiration post swallow - no decline in vocal quality or respiratory status. In addition, pt self-fed coffee (~4oz) and 2-3 small sips of Ensure via cup w/ standard straw, w/ no immediate overt s/s of aspiration (throat clearing, coughing choking, changes in respiratory patterns). During brief OM exam, no unilateral weakness was noted, however oral tremors were present.  Recommend: Dysphagia III (MECHANICAL soft) - cut meats (for ease of self-feeding) w/ extra gravies, sauces, butters for moisture and cohesion. Thin liquids w/ standard straw. Pt prefers plastic  utensils. Aspiration precautions (upright for meals for safer swallowing, small bites followed by liquids for oral clearing). Whole meds in Puree IF needed; Ensure ok too. NSG consulted and agreed. Pt appears at her baseline w/ swallowing; no further skilled ST services indicated. Consult ST services to f/u if any changes in speech, swallowing while admitted.  SLP Visit Diagnosis: Dysphagia,  oral phase (R13.11)    Aspiration Risk  Mild aspiration risk;Risk for inadequate nutrition/hydration - reduced risk following general precautions.   Diet Recommendation   Dysphagia III (MECHANICAL soft) w/ extra gravies, sauces, butters. Thin liquids - uses a standard straw. Feeds self but needs tray setup support. General aspiration precautions. Pills in Puree IF needed, for easier, safer swallowing. Medication Administration: Whole meds with puree if needed for safer swallowing.     Other  Recommendations Recommended Consults: Occupational Therapy & Dietician Oral Care Recommendations: Oral care BID;Oral care before and after PO Other Recommendations: Remove water pitcher   Follow up Recommendations Skilled Nursing facility; TBD      Frequency and Duration N/A      Prognosis Prognosis for Safe Diet Advancement: Good Barriers to Reach Goals: Severity of deficits(Parkinson's Disease)      Swallow Study   General Date of Onset: 10/07/18 HPI: Pt is a 85 y.o. female with a known history of Parkinson's, hypertension, hyperlipidemia, diabetes, anxiety and depression presents to the emergency department for evaluation of weakness status post fall.  Patient was in a usual state of health until 2 weeks ago when she describes the onset of aggressively worsening weakness, difficulty ambulating, requiring assistance with activities of daily living. States that she falls every day at home and wants to go to rehab to get stronger. She lives independently and uses a walker at home.  Type of Study: Bedside Swallow Evaluation Diet Prior to this Study: Regular Temperature Spikes Noted: No(WBC 9.8) Respiratory Status: Room air History of Recent Intubation: No Behavior/Cognition: Alert;Cooperative;Pleasant mood;Distractible;Requires cueing Oral Cavity Assessment: Within Functional Limits Oral Care Completed by SLP: No Oral Cavity - Dentition: Dentures, top;Dentures, bottom Vision: Functional for  self-feeding(glasses) Self-Feeding Abilities: Able to feed self;Able to feed self with adaptive devices;Needs set up(uses smaller, light utensils at home) Patient Positioning: Upright in bed Baseline Vocal Quality: Normal;Low vocal intensity Volitional Cough: Other (Comment)(NT)    Oral/Motor/Sensory Function Overall Oral Motor/Sensory Function: Mild impairment Facial ROM: Reduced right;Reduced left(munching rather than rotary chewing) Facial Symmetry: Within Functional Limits Facial Strength: Reduced right;Reduced left Lingual ROM: Within Functional Limits Lingual Symmetry: Within Functional Limits Lingual Strength: Within Functional Limits Lingual Sensation: Within Functional Limits Velum: Within Functional Limits Mandible: Within Functional Limits   Ice Chips Ice chips: Not tested   Thin Liquid Thin Liquid: Within functional limits Presentation: Cup;Self Fed;Straw Other Comments: standard straw    Nectar Thick Nectar Thick Liquid: Not tested   Honey Thick Honey Thick Liquid: Not tested   Puree Puree: Within functional limits Presentation: Self Fed;Spoon   Solid     Solid: Within functional limits Presentation: Self Fed;Spoon      Emeline General, Graduate Student SLP 10/09/2018,9:52 AM

## 2018-10-09 NOTE — Progress Notes (Addendum)
[place] [facility] [place] NAME: Angela Savage    MR#:  [id number]  DATE OF BIRTH:  12-Nov-1931  SUBJECTIVE:  Patient complains of generalized weakness/fatigue, patient states that she uses Xanax for her Parkinson's disease, neurology to see, would like to have her Prilosec/Protonix discontinued given nausea REVIEW OF SYSTEMS:  CONSTITUTIONAL: No fever, fatigue or weakness.  EYES: No blurred or double vision.  EARS, NOSE, AND THROAT: No tinnitus or ear pain.  RESPIRATORY: No cough, shortness of breath, wheezing or hemoptysis.  CARDIOVASCULAR: No chest pain, orthopnea, edema.  GASTROINTESTINAL: No nausea, vomiting, diarrhea or abdominal pain.  GENITOURINARY: No dysuria, hematuria.  ENDOCRINE: No polyuria, nocturia,  HEMATOLOGY: No anemia, easy bruising or bleeding SKIN: No rash or lesion. MUSCULOSKELETAL: No joint pain or arthritis.   NEUROLOGIC: No tingling, numbness, weakness.  PSYCHIATRY: No anxiety or depression.   ROS  DRUG ALLERGIES:   Allergies  Allergen Reactions  . Avandia [Rosiglitazone Maleate] Other (See Comments)    Rapid heart rate  . Carbidopa-Levodopa   . Ciprofloxacin     Heart problems   . Codeine   . Glucovance [Glyburide-Metformin] Other (See Comments)    Rapid heart beat  . Rosiglitazone Other (See Comments) and Palpitations    Chest pain.    VITALS:  Blood pressure 132/74, pulse 92, temperature 98.1 F (36.7 C), temperature source Oral, resp. rate 20, height 5\' 3"  (1.6 m), weight 54.4 kg, SpO2 95 %.  PHYSICAL EXAMINATION:  GENERAL:  85 y.o.-year-old patient lying in the bed with no acute distress.  EYES: Pupils equal, round, reactive to light and accommodation. No scleral icterus. Extraocular muscles intact.  HEENT: Head atraumatic, normocephalic. Oropharynx and nasopharynx clear.  NECK:  Supple, no jugular venous distention. No thyroid enlargement, no tenderness.  LUNGS: Normal breath sounds bilaterally,  no wheezing, rales,rhonchi or crepitation. No use of accessory muscles of respiration.  CARDIOVASCULAR: S1, S2 normal. No murmurs, rubs, or gallops.  ABDOMEN: Soft, nontender, nondistended. Bowel sounds present. No organomegaly or mass.  EXTREMITIES: No pedal edema, cyanosis, or clubbing.  NEUROLOGIC: Cranial nerves II through XII are intact. Muscle strength 5/5 in all extremities. Sensation intact. Gait not checked.  PSYCHIATRIC: The patient is alert and oriented x 3.  SKIN: No obvious rash, lesion, or ulcer.   Physical Exam LABORATORY PANEL:   CBC Recent Labs  Lab 10/08/18 0509  WBC 9.8  HGB 11.5*  HCT 36.2  PLT 304   ------------------------------------------------------------------------------------------------------------------  Chemistries  Recent Labs  Lab 10/08/18 0117 10/08/18 0509  NA  --  138  K  --  3.5  CL  --  98  CO2  --  32  GLUCOSE  --  154*  BUN  --  15  CREATININE  --  0.58  CALCIUM  --  9.2  MG 1.6*  --   AST  --  25  ALT  --  15  ALKPHOS  --  36*  BILITOT  --  1.2   ------------------------------------------------------------------------------------------------------------------  Cardiac Enzymes Recent Labs  Lab 10/08/18 0509 10/08/18 1545  TROPONINI <0.03 <0.03   ------------------------------------------------------------------------------------------------------------------  RADIOLOGY:  Dg Chest 2 View  Result Date: 10/07/2018 CLINICAL DATA:  Generalized weakness for the past 2 weeks. Diffuse myalgias. EXAM: CHEST - 2 VIEW COMPARISON:  03/19/2014. FINDINGS: Normal sized heart. Tortuous and partially calcified thoracic aorta. The lungs remain clear with stable mild hyperexpansion of the lungs and mild diffuse peribronchial thickening and accentuation of the interstitial markings. Stable left  diaphragmatic eventration and right axillary surgical clips. Diffuse osteopenia. IMPRESSION: No acute abnormality. Stable mild changes of COPD and  chronic bronchitis. Electronically Signed   By: Claudie Revering M.D.   On: 10/07/2018 17:42   Ct Head Wo Contrast  Result Date: 10/08/2018 CLINICAL DATA:  Muscle weakness EXAM: CT HEAD WITHOUT CONTRAST TECHNIQUE: Contiguous axial images were obtained from the base of the skull through the vertex without intravenous contrast. COMPARISON:  10/17/2016 FINDINGS: Brain: No evidence of acute infarction, hemorrhage, extra-axial collection, ventriculomegaly, or mass effect. Generalized cerebral atrophy. Periventricular white matter low attenuation likely secondary to microangiopathy. Vascular: Cerebrovascular atherosclerotic calcifications are noted. Skull: Negative for fracture or focal lesion. Sinuses/Orbits: Visualized portions of the orbits are unremarkable. Visualized portions of the paranasal sinuses and mastoid air cells are unremarkable. Other: None. IMPRESSION: No acute intracranial pathology. Electronically Signed   By: Kathreen Devoid   On: 10/08/2018 02:27   Ct Abdomen Pelvis W Contrast  Result Date: 10/07/2018 CLINICAL DATA:  85 year old female with weakness for 2 weeks. Frequent falls. EXAM: CT ABDOMEN AND PELVIS WITH CONTRAST TECHNIQUE: Multidetector CT imaging of the abdomen and pelvis was performed using the standard protocol following bolus administration of intravenous contrast. CONTRAST:  70mL ISOVUE-300 IOPAMIDOL (ISOVUE-300) INJECTION 61% COMPARISON:  CT Abdomen and Pelvis 11/05/2015 and earlier. FINDINGS: Lower chest: Negative; small fat containing Bochdalek's hernia (normal variant). Hepatobiliary: Negative liver and gallbladder. Pancreas: Negative aside from chronic pancreatic atrophy. Spleen: Negative. Adrenals/Urinary Tract: Normal adrenal glands. Chronic benign right renal cysts. Tiny left renal cysts. Symmetric renal enhancement and contrast excretion. Decompressed proximal ureters. Diminutive and unremarkable urinary bladder. Stomach/Bowel: Mild distension of the rectum by gas and stool.  Decompressed distal sigmoid. Mildly redundant and gas-filled proximal sigmoid. Similar gas in the descending and transverse colon segments which are mildly redundant. Negative right colon. Occasional large bowel diverticula but no active inflammation. Negative terminal ileum. Diminutive or absent appendix. No dilated small bowel. Negative stomach and duodenum. No free air, free fluid. Vascular/Lymphatic: Mild Aortoiliac calcified atherosclerosis. Major arterial structures in the abdomen and pelvis are patent. Portal venous system is patent. No lymphadenopathy. Reproductive: Surgically absent uterus with diminutive or absent ovaries. Other: No pelvic free fluid. Small fat containing left inguinal hernia is stable. Musculoskeletal: Osteopenia. Chronic proximal left femur ORIF. No acute osseous abnormality identified. IMPRESSION: Stable since 2017. No acute or inflammatory process in the abdomen or pelvis. Electronically Signed   By: Genevie Ann M.D.   On: 10/07/2018 20:36   Dg Foot 2 Views Right  Result Date: 10/09/2018 CLINICAL DATA:  Right foot pain. No reported injury. EXAM: RIGHT FOOT - 2 VIEW COMPARISON:  None. FINDINGS: Mild right hallux valgus deformity with degenerative changes in the first metatarsal-phalangeal joint. Diffuse bone demineralization. No evidence of acute fracture or dislocation. No focal bone lesion or bone destruction. Vascular calcifications in the soft tissues. IMPRESSION: Hallux valgus deformity with degenerative changes in the first metatarsal-phalangeal joint. No acute bony abnormalities. Electronically Signed   By: Lucienne Capers M.D.   On: 10/09/2018 00:46    ASSESSMENT AND PLAN:  This is a 85 y.o. female with a history of Parkinson's, hypertension, hyperlipidemia, diabetes, anxiety and depression now being admitted with acute fall  *Acute on chronic weakness, deconditioning, adult failure to thrive  Stable Compounded by history of Parkinson's disease-not on medications,  Physical therapy recommending skilled nursing facility placement, OT/speech therapy/dietary to see, aspiration/fall/skin care precautions while in house, await neurology evaluation/recommendations, CT head negative for any acute process   *  Acute hypokalemia  Repleted   *Acute hypomagnesemia  Repleted with IV magnesium  *Chronic diabetes mellitus type 2  Able on current regiment   *History of Parkinson's disease Now on medications Plan of care as stated above  *Chronic moderate to severe protein energy/calorie malnutrition Dietary consulted  DVT prophylaxis-Lovenox subcu Disposition to skilled nursing facility status post discharge in 1 to 2 days barring any complications   All the records are reviewed and case discussed with Care Management/Social Workerr. Management plans discussed with the patient, family and they are in agreement.  CODE STATUS: full TOTAL TIME TAKING CARE OF THIS PATIENT: 40 minutes.     POSSIBLE D/C IN 1-2 DAYS, DEPENDING ON CLINICAL CONDITION.   Avel Peace  M.D on 10/09/2018   Between 7am to 6pm - Pager - [phone]  After 6pm go to [email or web address] - password [email or web address] [facility]  Office  [phone]  CC: Primary care physician; Remi Haggard, FNP  Note: This dictation was prepared with Dragon dictation along with smaller phrase technology. Any transcriptional errors that result from this process are unintentional.

## 2018-10-09 NOTE — Progress Notes (Signed)
Initial Nutrition Assessment  DOCUMENTATION CODES:   Not applicable  INTERVENTION:  -Ensure Enlive po BID, each supplement provides 350 kcal and 20 grams of protein -Magic cup TID with meals, each supplement provides 290 kcal and 9 grams of protein -Encouraged small frequent meals throughout the day -Possible appetite stimulant if MD agreeable   NUTRITION DIAGNOSIS:   Moderate Malnutrition related to chronic illness as evidenced by energy intake < 75% for > or equal to 3 months, moderate fat depletion, mild muscle depletion.   GOAL:   Patient will meet greater than or equal to 90% of their needs   MONITOR:   PO intake, Weight trends, Supplement acceptance  REASON FOR ASSESSMENT:   Consult, Malnutrition Screening Tool Assessment of nutrition requirement/status  ASSESSMENT:  ,  85 year old female admitted with increasing weakness and falls secondary to Parkinson's Disease. Past medical history significant for HTN, T2DM, anxiety, depression, breast cancer w/ Rt breast lumpectomy, diverticulitis, endometrial cancer, total abdominal hysterectomy w/ bilateral salpingostomatomy, GERD, peptic ulcer  Per Neurology note pt is currently not on anti-Parkinson medication due to side effects, pt experienced diarrhea and bloody stools. Neurology recommends outpt MRI and medical management, and continued PT/OT inpatient efforts.   Patient sitting up in bed with noticeable bilateral hand tremors consistent during RD visit, son at bedside. Patient reports poor home PO consisting of small [employer] mashed pots and 1 can of cream of chx soup for gravy; soup is used over 3 day period. Patient stated that she nibbles on 1 strawberry pop-tart throughout the day and has 2 Glucerna daily.   Encouraged pt to increase meals frequency to every 3 hours, including small meals/bites of food to assist in stimulating appetite. Patient agreeable to trying and tells RD that her daughter has given her the same advice.  Patient expresses her desire to eat more, but feels anxious about her blood sugar increasing. RD encouraged patient to keep checking her BS as she does at home and her PCP can make adjustments to her medications if need be. RD suggested having one of her ONS be Boost Plus for extra calorie/protein needs.  Medications reviewed and include: alprazolam, alum & mag hydroxide, potassium chloride 84mEq tablet,  lovenox, SSI, lantus 14 units daily  Labs: Glucose 154 (H) Lab Results  Component Value Date   HGBA1C 6.9 (H) 10/08/2018    2/5 SLP: Dysphagia III w/ extra gravies, sauces, butters. Thin liquids - uses a standard straw. Feeds self but needs tray setup support. General aspiration precautions.   NUTRITION - FOCUSED PHYSICAL EXAM:    Diet Order:   Diet Order            DIET DYS 3 Room service appropriate? Yes with Assist; Fluid consistency: Thin  Diet effective now              EDUCATION NEEDS:   Education needs have been addressed  Skin:  Skin Assessment: Reviewed RN Assessment  Last BM:  10/06/18  Height:   Ht Readings from Last 1 Encounters:  10/07/18 5\' 3"  (1.6 m)    Weight:   Wt Readings from Last 1 Encounters:  10/07/18 54.4 kg    Ideal Body Weight:  52.3 kg  BMI:  Body mass index is 21.26 kg/m.  Estimated Nutritional Needs:   Kcal:  6712-4580  Protein:  65-77 grams  Fluid:  1.3L/day    Lajuan Lines, RD, LDN  After Hours/Weekend Pager: [phone]

## 2018-10-09 NOTE — Progress Notes (Signed)
OT Cancellation Note  Patient Details Name: Angela Savage MRN: [id number] DOB: 05-20-1932   Cancelled Treatment:    Reason Eval/Treat Not Completed: Other (comment). SLP then MD then nurse tech in room. Will re-attempt at later time when pt is available for OT evaluation.  Jeni Salles, MPH, MS, OTR/L ascom [phone] 10/09/18, 9:40 AM

## 2018-10-10 DIAGNOSIS — G20 Parkinson's disease: Secondary | ICD-10-CM | POA: Diagnosis present

## 2018-10-10 LAB — GLUCOSE, CAPILLARY
Glucose-Capillary: 283 mg/dL — ABNORMAL HIGH (ref 70–99)
Glucose-Capillary: 318 mg/dL — ABNORMAL HIGH (ref 70–99)
Glucose-Capillary: 127 mg/dL — ABNORMAL HIGH (ref 70–99)

## 2018-10-10 MED ORDER — ENSURE ENLIVE PO LIQD: 237.0000 mL | Freq: Two times a day (BID) | ORAL | 0 refills | Status: AC

## 2018-10-10 MED ORDER — ADULT MULTIVITAMIN W/MINERALS CH: 1.0000 | ORAL_TABLET | Freq: Every day | ORAL | 0 refills | Status: AC

## 2018-10-10 MED ORDER — ALPRAZOLAM 1 MG PO TABS: 1.0000 mg | ORAL_TABLET | Freq: Two times a day (BID) | ORAL | 0 refills | Status: DC

## 2018-10-10 NOTE — Discharge Planning (Signed)
Patient IV removed.  RN assessment and VS revealed stability for DC to [facility]. Gave report and s/w Armandina Stammer, LPN.  Discharge papers printed and please in packet with signed scripts.  EMS contacted to transport to room 803. Waiting on arrival.

## 2018-10-10 NOTE — Progress Notes (Addendum)
[facility] NAME: Emmalyn Hinson    MR#:  [id number]  DATE OF BIRTH:  [birth year]  SUBJECTIVE:  Patient complains of generalized weakness/fatigue, patient states that she uses Xanax for her Parkinson's disease, neurology input appreciated-patient refuses treatment, daughter is at the bedside, case discussed as a group with case management/social services-awaiting placement REVIEW OF SYSTEMS:  CONSTITUTIONAL: No fever, fatigue or weakness.  EYES: No blurred or double vision.  EARS, NOSE, AND THROAT: No tinnitus or ear pain.  RESPIRATORY: No cough, shortness of breath, wheezing or hemoptysis.  CARDIOVASCULAR: No chest pain, orthopnea, edema.  GASTROINTESTINAL: No nausea, vomiting, diarrhea or abdominal pain.  GENITOURINARY: No dysuria, hematuria.  ENDOCRINE: No polyuria, nocturia,  HEMATOLOGY: No anemia, easy bruising or bleeding SKIN: No rash or lesion. MUSCULOSKELETAL: No joint pain or arthritis.   NEUROLOGIC: No tingling, numbness, weakness.  PSYCHIATRY: No anxiety or depression.   ROS  DRUG ALLERGIES:   Allergies  Allergen Reactions  . Avandia [Rosiglitazone Maleate] Other (See Comments)    Rapid heart rate  . Carbidopa-Levodopa   . Ciprofloxacin     Heart problems   . Codeine   . Glucovance [Glyburide-Metformin] Other (See Comments)    Rapid heart beat  . Rosiglitazone Other (See Comments) and Palpitations    Chest pain.    VITALS:  Blood pressure 110/62, pulse 84, temperature 98.7 F (37.1 C), temperature source Oral, resp. rate 18, height 5\' 3"  (1.6 m), weight 54.4 kg, SpO2 93 %.  PHYSICAL EXAMINATION:  GENERAL:  85 y.o.-year-old patient lying in the bed with no acute distress.  EYES: Pupils equal, round, reactive to light and accommodation. No scleral icterus. Extraocular muscles intact.  HEENT: Head atraumatic, normocephalic. Oropharynx and nasopharynx clear.  NECK:  Supple, no jugular venous distention. No  thyroid enlargement, no tenderness.  LUNGS: Normal breath sounds bilaterally, no wheezing, rales,rhonchi or crepitation. No use of accessory muscles of respiration.  CARDIOVASCULAR: S1, S2 normal. No murmurs, rubs, or gallops.  ABDOMEN: Soft, nontender, nondistended. Bowel sounds present. No organomegaly or mass.  EXTREMITIES: No pedal edema, cyanosis, or clubbing.  NEUROLOGIC: Cranial nerves II through XII are intact. Muscle strength 5/5 in all extremities. Sensation intact. Gait not checked.  PSYCHIATRIC: The patient is alert and oriented x 3.  SKIN: No obvious rash, lesion, or ulcer.   Physical Exam LABORATORY PANEL:   CBC Recent Labs  Lab 10/08/18 0509  WBC 9.8  HGB 11.5*  HCT 36.2  PLT 304   ------------------------------------------------------------------------------------------------------------------  Chemistries  Recent Labs  Lab 10/08/18 0117 10/08/18 0509  NA  --  138  K  --  3.5  CL  --  98  CO2  --  32  GLUCOSE  --  154*  BUN  --  15  CREATININE  --  0.58  CALCIUM  --  9.2  MG 1.6*  --   AST  --  25  ALT  --  15  ALKPHOS  --  36*  BILITOT  --  1.2   ------------------------------------------------------------------------------------------------------------------  Cardiac Enzymes Recent Labs  Lab 10/08/18 0509 10/08/18 1545  TROPONINI <0.03 <0.03   ------------------------------------------------------------------------------------------------------------------  RADIOLOGY:  Dg Foot 2 Views Right  Result Date: 10/09/2018 CLINICAL DATA:  Right foot pain. No reported injury. EXAM: RIGHT FOOT - 2 VIEW COMPARISON:  None. FINDINGS: Mild right hallux valgus deformity with degenerative changes in the first metatarsal-phalangeal joint. Diffuse bone demineralization. No evidence of acute fracture or dislocation. No focal bone  lesion or bone destruction. Vascular calcifications in the soft tissues. IMPRESSION: Hallux valgus deformity with degenerative changes  in the first metatarsal-phalangeal joint. No acute bony abnormalities. Electronically Signed   By: Lucienne Capers M.D.   On: 10/09/2018 00:46    ASSESSMENT AND PLAN:  This is a 85 y.o. female with a history of Parkinson's, hypertension, hyperlipidemia, diabetes, anxiety and depression now being admitted with acute fall  *Acute on chronic weakness, deconditioning, adult failure to thrive  Stable Compounded by history of Parkinson's disease-not on medications, Physical therapy recommending skilled nursing facility placement, speech therapy recommending mechanical soft diet with cut meat, dietary consulted, continue aspiration/fall/skin care precautions while in house, neurology input appreciated-patient refuses treatment of Parkinson's disease due to side effects from medications, CT head negative for any acute process   *Acute hypokalemia  Repleted   *Acute hypomagnesemia  Repleted with IV magnesium  *Chronic diabetes mellitus type 2  Stable on current regiment   *History of Parkinson's disease Not on medications by choice, neurology did see patient while in house-patient refuses treatment of her Parkinson's disease due to side effects from medications prior  Plan of care as stated above  *Chronic moderate to severe protein energy/calorie malnutrition Dietary consulted  DVT prophylaxis-Lovenox subcu Disposition to skilled nursing facility status post discharge when bed is available, all questions were answered to the patient and her daughter, case management/social work assisting with disposition   All the records are reviewed and case discussed with Care Management/Social Workerr. Management plans discussed with the patient, family and they are in agreement.  CODE STATUS: full TOTAL TIME TAKING CARE OF THIS PATIENT: 40 minutes.     POSSIBLE D/C IN 0-5 DAYS, DEPENDING ON CLINICAL CONDITION.   Avel Peace Cristine Daw M.D on 10/10/2018   Between 7am to 6pm - Pager -  [phone]  After 6pm go to [email or web address] - password [email or web address] [facility]  Office  [phone]  CC: Primary care physician; Remi Haggard, FNP  Note: This dictation was prepared with Dragon dictation along with smaller phrase technology. Any transcriptional errors that result from this process are unintentional.

## 2018-10-10 NOTE — Clinical Social Work Note (Signed)
Patient is medically ready for discharge today to [facility]. CSW notified Otila Kluver at [facility] of discharge today. Otila Kluver states that they have received insurance authorization. CSW also notified patient and daughter at bedside of discharge today. Both are in agreement. Patient will be transported by EMS. RN to call report and call for transport.   [place], [place]

## 2018-10-10 NOTE — Progress Notes (Signed)
   10/10/18 1000  Clinical Encounter Type  Visited With Patient and family together  Visit Type Initial  Spiritual Encounters  Spiritual Needs Prayer;Emotional;Grief support  Stress Factors  Family Stress Factors Major life changes  Ch visited pt. Pt shared her concerns about going to a nursing home and finding a facility that she likes. Pt also shared the losses of her many siblings and how she had to take care of the funerals. Ch provided a caring presence and a listening ear and pt prayed with the ch. Ch also talked to her daughter Coralyn Mark whose responsibility for taking care of her mother is likely going to increase. Daughter has good family support and spiritual support from her church pastor. The visit was appreciated.

## 2018-10-10 NOTE — Discharge Summary (Signed)
[facility] at [place] NAME: Angela Savage    MR#:  [id number]  DATE OF BIRTH:  [birth year]  DATE OF ADMISSION:  10/07/2018 ADMITTING PHYSICIAN: Ubaldo Glassing Hugelmeyer, DO  DATE OF DISCHARGE: No discharge date for patient encounter.  PRIMARY CARE PHYSICIAN: Remi Haggard, FNP    ADMISSION DIAGNOSIS:  Weakness [R53.1]  DISCHARGE DIAGNOSIS:  Active Problems:   Weakness   Parkinson's disease ([place])   SECONDARY DIAGNOSIS:   Past Medical History:  Diagnosis Date  . Allergy   . Anemia   . Anxiety and depression   . Breast cancer ([place])   . Cataracts, bilateral   . Diabetes mellitus   . Diverticulitis   . Endometrial cancer ([place])   . GERD (gastroesophageal reflux disease)   . Hyperlipidemia   . Hypertension   . Parkinson's disease ([place])   . Peptic ulcer   . Tremor   . Umbilical hernia     HOSPITAL COURSE:  This is a13 y.o.femalewith a history of Parkinson's,hypertension, hyperlipidemia, diabetes, anxiety and depressionnow being admitted with acute fall  *Acute on chronic weakness, deconditioning, adult failure to thrive  Stable Compounded by history of Parkinson's disease-not on medications, Physical therapy recommending skilled nursing facility placement, speech therapy recommending mechanical soft diet with cut meat, dietary consulted, continue aspiration/fall/skin care precautions while in house, neurology input appreciated-patient refuses treatment of Parkinson's disease due to side effects from medications, CT head negative for any acute process   *Acute hypokalemia  Repleted   *Acute hypomagnesemia  Repleted with IV magnesium  *Chronic diabetes mellitus type 2  Stable on current regiment   *History of Parkinson's disease Not on medications by choice, neurology did see patient while in house-patient refuses treatment of her Parkinson's disease due to side effects from medications prior  Plan of care as  stated above  *Chronic moderate to severe protein energy/calorie malnutrition Dietary consulted Continue meal supplementation   DISCHARGE CONDITIONS:   stable  CONSULTS OBTAINED:  Treatment Team:  Catarina Hartshorn, MD Arnaldo Natal, MD  DRUG ALLERGIES:   Allergies  Allergen Reactions  . Avandia [Rosiglitazone Maleate] Other (See Comments)    Rapid heart rate  . Carbidopa-Levodopa   . Ciprofloxacin     Heart problems   . Codeine   . Glucovance [Glyburide-Metformin] Other (See Comments)    Rapid heart beat  . Rosiglitazone Other (See Comments) and Palpitations    Chest pain.    DISCHARGE MEDICATIONS:   Allergies as of 10/10/2018      Reactions   Avandia [rosiglitazone Maleate] Other (See Comments)   Rapid heart rate   Carbidopa-levodopa    Ciprofloxacin    Heart problems   Codeine    Glucovance [glyburide-metformin] Other (See Comments)   Rapid heart beat   Rosiglitazone Other (See Comments), Palpitations   Chest pain.      Medication List    TAKE these medications   acetaminophen 325 MG tablet Commonly known as:  TYLENOL Take 650 mg by mouth every 4 (four) hours as needed.   ALPRAZolam 1 MG tablet Commonly known as:  XANAX Take 1 tablet (1 mg total) by mouth 2 (two) times daily.   aspirin 81 MG tablet Take 81 mg by mouth daily.   AZO BLADDER CONTROL/GO-LESS PO Take 1 tablet by mouth 2 (two) times a week.   bismuth subsalicylate 657 QI/69GE suspension Commonly known as:  PEPTO BISMOL Take by mouth as needed.   feeding supplement (ENSURE ENLIVE) Liqd  Take 237 mLs by mouth 2 (two) times daily between meals.   fluticasone 50 MCG/ACT nasal spray Commonly known as:  FLONASE Place 2 sprays into both nostrils daily.   GAVISCON PO Take 1 tablet by mouth 3 (three) times daily before meals.   glucose blood test strip Accu-Chek Compact Test strips   hydrochlorothiazide 25 MG tablet Commonly known as:  HYDRODIURIL Take 25 mg by mouth every  morning.   LANTUS SOLOSTAR 100 UNIT/ML Solostar Pen Generic drug:  Insulin Glargine Inject 14 Units into the skin daily at 12 noon.   metFORMIN 1000 MG tablet Commonly known as:  GLUCOPHAGE Take 1,000 mg by mouth 2 (two) times daily with a meal.   metoprolol succinate 50 MG 24 hr tablet Commonly known as:  TOPROL-XL   multivitamin with minerals Tabs tablet Take 1 tablet by mouth daily. Start taking on:  October 11, 2018   potassium chloride SA 20 MEQ tablet Commonly known as:  K-DUR,KLOR-CON Take 20 mEq by mouth daily.   pramipexole 1.5 MG tablet Commonly known as:  MIRAPEX Take 1.5 mg by mouth daily.   Vitamin D3 125 MCG (5000 UT) Caps Take by mouth. Take 1 capsule 3 times a week        DISCHARGE INSTRUCTIONS:      If you experience worsening of your admission symptoms, develop shortness of breath, life threatening emergency, suicidal or homicidal thoughts you must seek medical attention immediately by calling 911 or calling your MD immediately  if symptoms less severe.  You Must read complete instructions/literature along with all the possible adverse reactions/side effects for all the Medicines you take and that have been prescribed to you. Take any new Medicines after you have completely understood and accept all the possible adverse reactions/side effects.   Please note  You were cared for by a hospitalist during your hospital stay. If you have any questions about your discharge medications or the care you received while you were in the hospital after you are discharged, you can call the unit and asked to speak with the hospitalist on call if the hospitalist that took care of you is not available. Once you are discharged, your primary care physician will handle any further medical issues. Please note that NO REFILLS for any discharge medications will be authorized once you are discharged, as it is imperative that you return to your primary care physician (or establish  a relationship with a primary care physician if you do not have one) for your aftercare needs so that they can reassess your need for medications and monitor your lab values.    Today   CHIEF COMPLAINT:   Chief Complaint  Patient presents with  . Fall  . Weakness    HISTORY OF PRESENT ILLNESS:   85 y.o. female with a known history of Parkinson's, hypertension, hyperlipidemia, diabetes, anxiety and depression presents to the emergency department for evaluation of weakness status post fall.  Patient was in a usual state of health until 2 weeks ago when she describes the onset of aggressively worsening weakness, difficulty ambulating, requiring assistance with activities of daily living. States that she falls every day at home and wants to go to rehab to get stronger. She lives independently and uses a walker at home.   Patient denies fevers/chills, diizziness, chest pain, shortness of breath, N/V/C/D, abdominal pain, dysuria/frequency, changes in mental status.    VITAL SIGNS:  Blood pressure 110/62, pulse 84, temperature 98.7 F (37.1 C), temperature source Oral, resp.  rate 18, height 5\' 3"  (1.6 m), weight 54.4 kg, SpO2 93 %.  I/O:    Intake/Output Summary (Last 24 hours) at 10/10/2018 1340 Last data filed at 10/10/2018 1000 Gross per 24 hour  Intake -  Output 1140 ml  Net -1140 ml    PHYSICAL EXAMINATION:  GENERAL:  85 y.o.-year-old patient lying in the bed with no acute distress.  EYES: Pupils equal, round, reactive to light and accommodation. No scleral icterus. Extraocular muscles intact.  HEENT: Head atraumatic, normocephalic. Oropharynx and nasopharynx clear.  NECK:  Supple, no jugular venous distention. No thyroid enlargement, no tenderness.  LUNGS: Normal breath sounds bilaterally, no wheezing, rales,rhonchi or crepitation. No use of accessory muscles of respiration.  CARDIOVASCULAR: S1, S2 normal. No murmurs, rubs, or gallops.  ABDOMEN: Soft, non-tender,  non-distended. Bowel sounds present. No organomegaly or mass.  EXTREMITIES: No pedal edema, cyanosis, or clubbing.  NEUROLOGIC: Cranial nerves II through XII are intact. Muscle strength 5/5 in all extremities. Sensation intact. Gait not checked.  PSYCHIATRIC: The patient is alert and oriented x 3.  SKIN: No obvious rash, lesion, or ulcer.   DATA REVIEW:   CBC Recent Labs  Lab 10/08/18 0509  WBC 9.8  HGB 11.5*  HCT 36.2  PLT 304    Chemistries  Recent Labs  Lab 10/08/18 0117 10/08/18 0509  NA  --  138  K  --  3.5  CL  --  98  CO2  --  32  GLUCOSE  --  154*  BUN  --  15  CREATININE  --  0.58  CALCIUM  --  9.2  MG 1.6*  --   AST  --  25  ALT  --  15  ALKPHOS  --  36*  BILITOT  --  1.2    Cardiac Enzymes Recent Labs  Lab 10/08/18 1545  TROPONINI <0.03    Microbiology Results  Results for orders placed or performed in visit on 01/15/18  Microscopic Examination     Status: Abnormal   Collection Time: 01/15/18  2:17 PM  Result Value Ref Range Status   WBC, UA None seen 0 - 5 /hpf Final   RBC, UA 3-10 (A) 0 - 2 /hpf Final   Epithelial Cells (non renal) 0-10 0 - 10 /hpf Final   Casts Present (A) None seen /lpf Final   Cast Type Hyaline casts N/A Final   Mucus, UA Present (A) Not Estab. Final   Bacteria, UA Many (A) None seen/Few Final  CULTURE, URINE COMPREHENSIVE     Status: None   Collection Time: 01/15/18  2:59 PM  Result Value Ref Range Status   Urine Culture, Comprehensive Final report  Final   Organism ID, Bacteria Comment  Final    Comment: No growth in 36 - 48 hours.  Microscopic Examination     Status: Abnormal   Collection Time: 01/16/18  3:41 PM  Result Value Ref Range Status   WBC, UA 0-5 0 - 5 /hpf Final   RBC, UA 3-10 (A) 0 - 2 /hpf Final   Epithelial Cells (non renal) 0-10 0 - 10 /hpf Final   Renal Epithel, UA 0-10 (A) None seen /hpf Final   Bacteria, UA Few (A) None seen/Few Final  CULTURE, URINE COMPREHENSIVE     Status: None    Collection Time: 01/16/18  3:52 PM  Result Value Ref Range Status   Urine Culture, Comprehensive Final report  Final   Organism ID, Bacteria Comment  Final  Comment: Mixed urogenital flora 10,000-25,000 colony forming units per mL     RADIOLOGY:  Dg Foot 2 Views Right  Result Date: 10/09/2018 CLINICAL DATA:  Right foot pain. No reported injury. EXAM: RIGHT FOOT - 2 VIEW COMPARISON:  None. FINDINGS: Mild right hallux valgus deformity with degenerative changes in the first metatarsal-phalangeal joint. Diffuse bone demineralization. No evidence of acute fracture or dislocation. No focal bone lesion or bone destruction. Vascular calcifications in the soft tissues. IMPRESSION: Hallux valgus deformity with degenerative changes in the first metatarsal-phalangeal joint. No acute bony abnormalities. Electronically Signed   By: Lucienne Capers M.D.   On: 10/09/2018 00:46    EKG:   Orders placed or performed during the hospital encounter of 10/07/18  . ED EKG within 10 minutes  . ED EKG within 10 minutes  . EKG 12-Lead  . EKG 12-Lead  . EKG 12-Lead      Management plans discussed with the patient, family and they are in agreement.  CODE STATUS:     Code Status Orders  (From admission, onward)         Start     Ordered   10/08/18 0055  Full code  Continuous     10/08/18 0054        Code Status History    This patient has a current code status but no historical code status.    Advance Directive Documentation     Most Recent Value  Type of Advance Directive  Healthcare Power of Attorney, Living will  Pre-existing out of facility DNR order (yellow form or pink MOST form)  -  "MOST" Form in Place?  -      TOTAL TIME TAKING CARE OF THIS PATIENT: 40 minutes.    Avel Peace Haili Donofrio M.D on 10/10/2018 at 1:40 PM  Between 7am to 6pm - Pager - [phone]  After 6pm go to [email or web address] - password [email or web address] [facility]  Office  [phone]  CC: Primary care  physician; Remi Haggard, FNP   Note: This dictation was prepared with Dragon dictation along with smaller phrase technology. Any transcriptional errors that result from this process are unintentional.

## 2018-10-10 NOTE — Care Management Obs Status (Signed)
[facility] NOTIFICATION   Patient Details  Name: Angela Savage MRN: [id number] Date of Birth: Jun 10, 1932   Medicare Observation Status Notification Given:  Yes Explained to patient/family    Shelbie Ammons, RN 10/10/2018, 9:33 AM

## 2018-10-10 NOTE — Clinical Social Work Note (Signed)
Clinical Social Work Assessment  Patient Details  Name: Angela Savage MRN: [id number] Date of Birth: 03/19/1932  Date of referral:  10/10/18               Reason for consult:  Facility Placement                Permission sought to share information with:  Case Manager, Facility Contact Representative, Family Supports Permission granted to share information::  Yes, Verbal Permission Granted  Name::      SNF  Agency::    County   Relationship::     Contact Information:     Housing/Transportation Living arrangements for the past 2 months:  Single Family Home Source of Information:  Patient Patient Interpreter Needed:  None Criminal Activity/Legal Involvement Pertinent to Current Situation/Hospitalization:  No - Comment as needed Significant Relationships:  Adult Children Lives with:  Self Do you feel safe going back to the place where you live?  Yes Need for family participation in patient care:  Yes (Comment)  Care giving concerns:  Patient lives alone    Social Worker assessment / plan:  CSW consulted for facility placement. CSW met with patient and daughter at bedside. Patient states that she lives alone and cares for herself. CSW explained PT recommendation of SNF. Patient and daughter are in agreement with SNF. CSW provided bed offers and patient and daughter chose [facility]. CSW explained that insurance authorization will need to be obtained and could take a day or two to receive. Patient and daughter state understanding of this. CSW notified Tina at [facility] of bed acceptance and authorization has been started. CSW also spoke with patient's son Robert Mccuistion via phone [phone] and explained above information to him as well. CSW will continue to follow for discharge planning.   Employment status:  Retired Insurance information:  Managed Medicare PT Recommendations:  Skilled Nursing Facility Information / Referral to community resources:  Skilled  Nursing Facility  Patient/Family's Response to care:  Patient and children thanked CSW for assistance   Patient/Family's Understanding of and Emotional Response to Diagnosis, Current Treatment, and Prognosis:  Patient and family in agreement with discharge plan.   Emotional Assessment Appearance:  Appears stated age Attitude/Demeanor/Rapport:    Affect (typically observed):  Accepting, Blunt Orientation:  Oriented to Self, Oriented to Place, Oriented to  Time, Oriented to Situation Alcohol / Substance use:  Not Applicable Psych involvement (Current and /or in the community):  No (Comment)  Discharge Needs  Concerns to be addressed:  Discharge Planning Concerns Readmission within the last 30 days:  No Current discharge risk:  Dependent with Mobility, Lives alone Barriers to Discharge:  Continued Medical Work up     , LCSWA 10/10/2018, 9:47 AM  

## 2018-10-10 NOTE — Care Management CC44 (Signed)
Condition Code 44 Documentation Completed  Patient Details  Name: Angela Savage MRN: [id number] Date of Birth: 04/27/1932   Condition Code 44 given:  Yes Patient signature on Condition Code 44 notice:  Yes Documentation of 2 MD's agreement:  Yes Code 44 added to claim:  Yes    Shelbie Ammons, RN 10/10/2018, 9:34 AM

## 2018-10-10 NOTE — Care Management Obs Status (Signed)
[facility] NOTIFICATION   Patient Details  Name: Angela Savage MRN: [id number] Date of Birth: 05/19/1932   Medicare Observation Status Notification Given:  Yes    Shelbie Ammons, RN 10/10/2018, 9:34 AM

## 2019-04-21 ENCOUNTER — Other Ambulatory Visit: Payer: Medicare Other

## 2019-07-01 ENCOUNTER — Emergency Department: Payer: Medicare Other

## 2019-07-01 ENCOUNTER — Inpatient Hospital Stay
Admission: EM | Admit: 2019-07-01 | Discharge: 2019-07-10 | DRG: 291 | Disposition: A | Discharge status: Skilled Nursing Facility | Payer: Medicare Other | Attending: Internal Medicine | Admitting: Internal Medicine

## 2019-07-01 ENCOUNTER — Encounter: Payer: Self-pay | Admitting: Emergency Medicine

## 2019-07-01 ENCOUNTER — Other Ambulatory Visit: Payer: Self-pay

## 2019-07-01 DIAGNOSIS — I110 Hypertensive heart disease with heart failure: Secondary | ICD-10-CM | POA: Diagnosis not present

## 2019-07-01 DIAGNOSIS — R079 Chest pain, unspecified: Secondary | ICD-10-CM

## 2019-07-01 DIAGNOSIS — Z681 Body mass index (BMI) 19 or less, adult: Secondary | ICD-10-CM

## 2019-07-01 DIAGNOSIS — E119 Type 2 diabetes mellitus without complications: Secondary | ICD-10-CM

## 2019-07-01 DIAGNOSIS — G9341 Metabolic encephalopathy: Secondary | ICD-10-CM | POA: Diagnosis not present

## 2019-07-01 DIAGNOSIS — J811 Chronic pulmonary edema: Secondary | ICD-10-CM | POA: Diagnosis present

## 2019-07-01 DIAGNOSIS — Z7982 Long term (current) use of aspirin: Secondary | ICD-10-CM

## 2019-07-01 DIAGNOSIS — L89152 Pressure ulcer of sacral region, stage 2: Secondary | ICD-10-CM | POA: Diagnosis present

## 2019-07-01 DIAGNOSIS — E785 Hyperlipidemia, unspecified: Secondary | ICD-10-CM | POA: Diagnosis present

## 2019-07-01 DIAGNOSIS — G20 Parkinson's disease: Secondary | ICD-10-CM | POA: Diagnosis present

## 2019-07-01 DIAGNOSIS — R1032 Left lower quadrant pain: Secondary | ICD-10-CM | POA: Diagnosis not present

## 2019-07-01 DIAGNOSIS — Z79899 Other long term (current) drug therapy: Secondary | ICD-10-CM

## 2019-07-01 DIAGNOSIS — E876 Hypokalemia: Secondary | ICD-10-CM | POA: Diagnosis present

## 2019-07-01 DIAGNOSIS — F05 Delirium due to known physiological condition: Secondary | ICD-10-CM | POA: Diagnosis not present

## 2019-07-01 DIAGNOSIS — Z803 Family history of malignant neoplasm of breast: Secondary | ICD-10-CM

## 2019-07-01 DIAGNOSIS — I10 Essential (primary) hypertension: Secondary | ICD-10-CM | POA: Diagnosis present

## 2019-07-01 DIAGNOSIS — Z794 Long term (current) use of insulin: Secondary | ICD-10-CM

## 2019-07-01 DIAGNOSIS — G20A1 Parkinson's disease without dyskinesia, without mention of fluctuations: Secondary | ICD-10-CM | POA: Diagnosis present

## 2019-07-01 DIAGNOSIS — L8990 Pressure ulcer of unspecified site, unspecified stage: Secondary | ICD-10-CM | POA: Insufficient documentation

## 2019-07-01 DIAGNOSIS — Z20828 Contact with and (suspected) exposure to other viral communicable diseases: Secondary | ICD-10-CM | POA: Diagnosis present

## 2019-07-01 DIAGNOSIS — R0602 Shortness of breath: Secondary | ICD-10-CM

## 2019-07-01 DIAGNOSIS — R531 Weakness: Secondary | ICD-10-CM

## 2019-07-01 DIAGNOSIS — K219 Gastro-esophageal reflux disease without esophagitis: Secondary | ICD-10-CM | POA: Diagnosis present

## 2019-07-01 DIAGNOSIS — I5033 Acute on chronic diastolic (congestive) heart failure: Secondary | ICD-10-CM | POA: Diagnosis present

## 2019-07-01 DIAGNOSIS — I4892 Unspecified atrial flutter: Secondary | ICD-10-CM | POA: Diagnosis not present

## 2019-07-01 DIAGNOSIS — R1311 Dysphagia, oral phase: Secondary | ICD-10-CM | POA: Diagnosis present

## 2019-07-01 DIAGNOSIS — Z800 Family history of malignant neoplasm of digestive organs: Secondary | ICD-10-CM

## 2019-07-01 DIAGNOSIS — Z888 Allergy status to other drugs, medicaments and biological substances status: Secondary | ICD-10-CM

## 2019-07-01 DIAGNOSIS — Z885 Allergy status to narcotic agent status: Secondary | ICD-10-CM

## 2019-07-01 DIAGNOSIS — Z8542 Personal history of malignant neoplasm of other parts of uterus: Secondary | ICD-10-CM

## 2019-07-01 DIAGNOSIS — E11649 Type 2 diabetes mellitus with hypoglycemia without coma: Secondary | ICD-10-CM | POA: Diagnosis not present

## 2019-07-01 DIAGNOSIS — Z853 Personal history of malignant neoplasm of breast: Secondary | ICD-10-CM

## 2019-07-01 DIAGNOSIS — E43 Unspecified severe protein-calorie malnutrition: Secondary | ICD-10-CM | POA: Insufficient documentation

## 2019-07-01 DIAGNOSIS — Z833 Family history of diabetes mellitus: Secondary | ICD-10-CM

## 2019-07-01 DIAGNOSIS — Z881 Allergy status to other antibiotic agents status: Secondary | ICD-10-CM

## 2019-07-01 DIAGNOSIS — Z8041 Family history of malignant neoplasm of ovary: Secondary | ICD-10-CM

## 2019-07-01 DIAGNOSIS — I509 Heart failure, unspecified: Secondary | ICD-10-CM

## 2019-07-01 DIAGNOSIS — Z823 Family history of stroke: Secondary | ICD-10-CM

## 2019-07-01 LAB — URINALYSIS, COMPLETE (UACMP) WITH MICROSCOPIC
Bilirubin Urine: NEGATIVE
Glucose, UA: NEGATIVE mg/dL
Ketones, ur: NEGATIVE mg/dL
Leukocytes,Ua: NEGATIVE
Nitrite: NEGATIVE
Protein, ur: 100 mg/dL — AB
Specific Gravity, Urine: 1.013 (ref 1.005–1.030)
Squamous Epithelial / HPF: NONE SEEN (ref 0–5)
pH: 5 (ref 5.0–8.0)

## 2019-07-01 LAB — BASIC METABOLIC PANEL
Anion gap: 13 (ref 5–15)
BUN: 15 mg/dL (ref 8–23)
CO2: 29 mmol/L (ref 22–32)
Calcium: 9.5 mg/dL (ref 8.9–10.3)
Chloride: 98 mmol/L (ref 98–111)
Creatinine, Ser: 0.62 mg/dL (ref 0.44–1.00)
GFR calc Af Amer: >60 mL/min (ref >60)
GFR calc non Af Amer: >60 mL/min (ref >60)
Glucose, Bld: 108 mg/dL — ABNORMAL HIGH (ref 70–99)
Potassium: 3.3 mmol/L — ABNORMAL LOW (ref 3.5–5.1)
Sodium: 140 mmol/L (ref 135–145)

## 2019-07-01 LAB — HEPATIC FUNCTION PANEL
ALT: 15 U/L (ref 0–44)
AST: 24 U/L (ref 15–41)
Albumin: 3.6 g/dL (ref 3.5–5.0)
Alkaline Phosphatase: 42 U/L (ref 38–126)
Bilirubin, Direct: <0.1 mg/dL (ref 0.0–0.2)
Total Bilirubin: 0.5 mg/dL (ref 0.3–1.2)
Total Protein: 7.3 g/dL (ref 6.5–8.1)

## 2019-07-01 LAB — CBC
HCT: 38.2 % (ref 36.0–46.0)
Hemoglobin: 12 g/dL (ref 12.0–15.0)
MCH: 27.5 pg (ref 26.0–34.0)
MCHC: 31.4 g/dL (ref 30.0–36.0)
MCV: 87.4 fL (ref 80.0–100.0)
Platelets: 384 10*3/uL (ref 150–400)
RBC: 4.37 MIL/uL (ref 3.87–5.11)
RDW: 13.8 % (ref 11.5–15.5)
WBC: 11.4 10*3/uL — ABNORMAL HIGH (ref 4.0–10.5)
nRBC: 0 % (ref 0.0–0.2)

## 2019-07-01 LAB — LIPASE, BLOOD: Lipase: 27 U/L (ref 11–51)

## 2019-07-01 LAB — GLUCOSE, CAPILLARY: Glucose-Capillary: 126 mg/dL — ABNORMAL HIGH (ref 70–99)

## 2019-07-01 LAB — TROPONIN I (HIGH SENSITIVITY): Troponin I (High Sensitivity): 15 ng/L (ref <18)

## 2019-07-01 MED ORDER — ONDANSETRON HCL 4 MG/2ML IJ SOLN
4.0000 mg | Freq: Four times a day (QID) | INTRAMUSCULAR | Status: DC | PRN
  Administered 2019-07-03: 4 mg via INTRAVENOUS
  Filled 2019-07-01: qty 2

## 2019-07-01 MED ORDER — ONDANSETRON HCL 4 MG PO TABS: 4.0000 mg | ORAL_TABLET | Freq: Four times a day (QID) | ORAL | Status: DC | PRN

## 2019-07-01 MED ORDER — INSULIN ASPART 100 UNIT/ML ~~LOC~~ SOLN
0.0000 [IU] | Freq: Every day | SUBCUTANEOUS | Status: DC
  Administered 2019-07-03: 2 [IU] via SUBCUTANEOUS
  Administered 2019-07-05: 22:00:00 3 [IU] via SUBCUTANEOUS
  Administered 2019-07-07 – 2019-07-09 (×2): 2 [IU] via SUBCUTANEOUS
  Filled 2019-07-01 (×4): qty 1

## 2019-07-01 MED ORDER — ASPIRIN 81 MG PO CHEW
81.0000 mg | CHEWABLE_TABLET | Freq: Every day | ORAL | Status: DC
  Administered 2019-07-02 – 2019-07-10 (×9): 81 mg via ORAL
  Filled 2019-07-01 (×9): qty 1

## 2019-07-01 MED ORDER — IOHEXOL 300 MG/ML  SOLN
75.0000 mL | Freq: Once | INTRAMUSCULAR | Status: AC | PRN
  Administered 2019-07-01: 20:00:00 75 mL via INTRAVENOUS

## 2019-07-01 MED ORDER — INSULIN ASPART 100 UNIT/ML ~~LOC~~ SOLN
0.0000 [IU] | Freq: Three times a day (TID) | SUBCUTANEOUS | Status: DC
  Administered 2019-07-02: 5 [IU] via SUBCUTANEOUS
  Administered 2019-07-02 – 2019-07-03 (×3): 2 [IU] via SUBCUTANEOUS
  Administered 2019-07-03: 5 [IU] via SUBCUTANEOUS
  Administered 2019-07-04: 3 [IU] via SUBCUTANEOUS
  Administered 2019-07-04: 2 [IU] via SUBCUTANEOUS
  Administered 2019-07-04 – 2019-07-05 (×2): 5 [IU] via SUBCUTANEOUS
  Administered 2019-07-05: 2 [IU] via SUBCUTANEOUS
  Administered 2019-07-06: 3 [IU] via SUBCUTANEOUS
  Administered 2019-07-06: 08:00:00 2 [IU] via SUBCUTANEOUS
  Administered 2019-07-06: 5 [IU] via SUBCUTANEOUS
  Administered 2019-07-07 (×2): 2 [IU] via SUBCUTANEOUS
  Administered 2019-07-07: 17:00:00 1 [IU] via SUBCUTANEOUS
  Administered 2019-07-08: 3 [IU] via SUBCUTANEOUS
  Administered 2019-07-08: 2 [IU] via SUBCUTANEOUS
  Administered 2019-07-08: 13:00:00 1 [IU] via SUBCUTANEOUS
  Administered 2019-07-09: 12:00:00 3 [IU] via SUBCUTANEOUS
  Administered 2019-07-09: 5 [IU] via SUBCUTANEOUS
  Administered 2019-07-09: 18:00:00 2 [IU] via SUBCUTANEOUS
  Administered 2019-07-10 (×2): 9 [IU] via SUBCUTANEOUS
  Filled 2019-07-01 (×25): qty 1

## 2019-07-01 MED ORDER — ACETAMINOPHEN 325 MG PO TABS
650.0000 mg | ORAL_TABLET | Freq: Four times a day (QID) | ORAL | Status: DC | PRN
  Administered 2019-07-02 – 2019-07-03 (×3): 650 mg via ORAL
  Filled 2019-07-01 (×3): qty 2

## 2019-07-01 MED ORDER — SODIUM CHLORIDE 0.9 % IV BOLUS
500.0000 mL | Freq: Once | INTRAVENOUS | Status: AC
  Administered 2019-07-01: 500 mL via INTRAVENOUS

## 2019-07-01 MED ORDER — ENOXAPARIN SODIUM 30 MG/0.3ML ~~LOC~~ SOLN
30.0000 mg | Freq: Every day | SUBCUTANEOUS | Status: DC
  Administered 2019-07-02 – 2019-07-09 (×9): 30 mg via SUBCUTANEOUS
  Filled 2019-07-01 (×9): qty 0.3

## 2019-07-01 MED ORDER — ACETAMINOPHEN 650 MG RE SUPP: 650.0000 mg | Freq: Four times a day (QID) | RECTAL | Status: DC | PRN

## 2019-07-01 NOTE — Progress Notes (Signed)
PHARMACIST - PHYSICIAN COMMUNICATION  CONCERNING:  Enoxaparin (Lovenox) for DVT Prophylaxis    RECOMMENDATION: Patient was prescribed enoxaprin 40mg  q24 hours for VTE prophylaxis.   Filed Weights   07/01/19 1539  Weight: 98 lb (44.5 kg)    Body mass index is 19.14 kg/m.  Estimated Creatinine Clearance: 36.8 mL/min (by C-G formula based on SCr of 0.62 mg/dL).  Patient is candidate for enoxaparin 30mg  every 24 hours based on CrCl <58ml/min or Weight less then 45kg   DESCRIPTION: Pharmacy has adjusted enoxaparin dose per [facility] policy.  Patient is now receiving enoxaparin 30mg  every 24 hours.  Ena Dawley, PharmD Clinical Pharmacist  07/01/2019 11:49 PM

## 2019-07-01 NOTE — ED Triage Notes (Signed)
Pt difficult to understand but states she does not feel well and is willing to do anything to get better.

## 2019-07-01 NOTE — H&P (Signed)
[facility] NAME: Angela Savage    MR#:  [id number]  DATE OF BIRTH:  [birth year]  DATE OF ADMISSION:  07/01/2019  PRIMARY CARE PHYSICIAN: Remi Haggard, FNP   REQUESTING/REFERRING PHYSICIAN: Jari Pigg, MD  CHIEF COMPLAINT:   Chief Complaint  Patient presents with  . Weakness  . Abdominal Pain    HISTORY OF PRESENT ILLNESS:  Angela Savage  is a 85 y.o. female who presents with chief complaint as above.  Patient presents the ED with a complaint of weakness and chest discomfort.  Her son states that she told him her chest was hurting this afternoon and to call 911.  Here in the ED tonight she elaborates quite a bit on prior GI complaints and test that she has had.  She states that her chest bothers her when she feels like she needs to burp but cannot.  Work-up otherwise is largely within normal limits, except for some mild pulmonary edema on x-ray which is new for her.  Hospitalist were called for admission for work-up for the same.  PAST MEDICAL HISTORY:   Past Medical History:  Diagnosis Date  . Allergy   . Anemia   . Anxiety and depression   . Breast cancer ([place])   . Cataracts, bilateral   . Diabetes mellitus   . Diverticulitis   . Endometrial cancer ([place])   . GERD (gastroesophageal reflux disease)   . Hyperlipidemia   . Hypertension   . Parkinson's disease ([place])   . Peptic ulcer   . Tremor   . Umbilical hernia      PAST SURGICAL HISTORY:   Past Surgical History:  Procedure Laterality Date  . [place]   right, after lumpectomy  . Bladder operation     Tumor ?  Marland Kitchen BREAST LUMPECTOMY  1997   right  . COLONOSCOPY  2008   Internal hemorrhoids, Dr. Vira Agar  . TOTAL ABDOMINAL HYSTERECTOMY W/ BILATERAL SALPINGOOPHORECTOMY  1955   malignant tumor  . UMBILICAL HERNIA REPAIR  2007     SOCIAL HISTORY:   Social History   Tobacco Use  . Smoking status: Never Smoker  .  Smokeless tobacco: Never Used  Substance Use Topics  . Alcohol use: No     FAMILY HISTORY:   Family History  Problem Relation Age of Onset  . Colon cancer Maternal Aunt   . Breast cancer Daughter        sisters x3  . Ovarian cancer Sister   . Diabetes Father        children  . Stroke Mother   . Kidney disease Neg Hx   . Bladder Cancer Neg Hx   . Colon polyps Neg Hx      DRUG ALLERGIES:   Allergies  Allergen Reactions  . Avandia [Rosiglitazone Maleate] Other (See Comments)    Rapid heart rate  . Carbidopa-Levodopa   . Ciprofloxacin     Heart problems   . Codeine   . Glucovance [Glyburide-Metformin] Other (See Comments)    Rapid heart beat  . Rosiglitazone Other (See Comments) and Palpitations    Chest pain.    MEDICATIONS AT HOME:   Prior to Admission medications   Medication Sig Start Date End Date Taking? Authorizing Provider  acetaminophen (TYLENOL) 325 MG tablet Take 650 mg by mouth every 4 (four) hours as needed.     [provider]  ALPRAZolam Duanne Moron) 1 MG tablet Take 1 tablet (  1 mg total) by mouth 2 (two) times daily. 10/10/18   Salary, Avel Peace, MD  Alum Hydroxide-Mag Carbonate (GAVISCON PO) Take 1 tablet by mouth 3 (three) times daily before meals.    [provider]  aspirin 81 MG tablet Take 81 mg by mouth daily.    [provider]  bismuth subsalicylate (PEPTO BISMOL) 262 MG/15ML suspension Take by mouth as needed.    [provider]  Cholecalciferol (VITAMIN D3) 5000 units CAPS Take by mouth. Take 1 capsule 3 times a week    [provider]  feeding supplement, ENSURE ENLIVE, (ENSURE ENLIVE) LIQD Take 237 mLs by mouth 2 (two) times daily between meals. 10/10/18   Salary, Avel Peace, MD  fluticasone (FLONASE) 50 MCG/ACT nasal spray Place 2 sprays into both nostrils daily.  08/26/15   [provider]  glucose blood test strip Accu-Chek Compact Test strips    [provider]  hydrochlorothiazide  25 MG tablet Take 25 mg by mouth every morning.      [provider]  LANTUS SOLOSTAR 100 UNIT/ML Solostar Pen Inject 14 Units into the skin daily at 12 noon.  08/11/15   [provider]  metFORMIN (GLUCOPHAGE) 1000 MG tablet Take 1,000 mg by mouth 2 (two) times daily with a meal.      [provider]  metFORMIN (GLUCOPHAGE-XR) 500 MG 24 hr tablet Take 500 mg by mouth daily. 06/26/19   [provider]  metoprolol succinate (TOPROL-XL) 50 MG 24 hr tablet  11/08/15   [provider]  Multiple Vitamin (MULTIVITAMIN WITH MINERALS) TABS tablet Take 1 tablet by mouth daily. 10/11/18   Salary, Avel Peace, MD  potassium chloride SA (K-DUR,KLOR-CON) 20 MEQ tablet Take 20 mEq by mouth daily.     [provider]  pramipexole (MIRAPEX) 1.5 MG tablet Take 1.5 mg by mouth daily.     [provider]  Pumpkin Seed-Soy Germ (AZO BLADDER CONTROL/GO-LESS PO) Take 1 tablet by mouth 2 (two) times a week.     [provider]  sucralfate (CARAFATE) 1 g tablet Take 1 g by mouth 4 (four) times daily. 06/26/19   [provider]    REVIEW OF SYSTEMS:  Review of Systems  Constitutional: Negative for chills, fever, malaise/fatigue and weight loss.  HENT: Negative for ear pain, hearing loss and tinnitus.   Eyes: Negative for blurred vision, double vision, pain and redness.  Respiratory: Negative for cough, hemoptysis and shortness of breath.   Cardiovascular: Positive for chest pain. Negative for palpitations, orthopnea and leg swelling.  Gastrointestinal: Negative for abdominal pain, constipation, diarrhea, nausea and vomiting.  Genitourinary: Negative for dysuria, frequency and hematuria.  Musculoskeletal: Negative for back pain, joint pain and neck pain.  Skin:       No acne, rash, or lesions  Neurological: Positive for weakness. Negative for dizziness, tremors and focal weakness.  Endo/Heme/Allergies: Negative for polydipsia. Does not  bruise/bleed easily.  Psychiatric/Behavioral: Negative for depression. The patient is not nervous/anxious and does not have insomnia.      VITAL SIGNS:   Vitals:   07/01/19 1538 07/01/19 1539 07/01/19 2000 07/01/19 2117  BP: (!) 139/91  140/73   Pulse: (!) 103  94   Resp: 18  17 (!) 22  Temp: 99.1 F (37.3 C)     TempSrc: Oral     SpO2: 97%  97%   Weight:  44.5 kg    Height:  5' (1.524 m)     [facility]  Readings from Last 3 Encounters:  07/01/19 44.5 kg  10/07/18 54.4 kg  07/22/18 54.4 kg    PHYSICAL EXAMINATION:  Physical Exam  Vitals reviewed. Constitutional: She is oriented to person, place, and time. She appears well-developed and well-nourished. No distress.  HENT:  Head: Normocephalic and atraumatic.  Mouth/Throat: Oropharynx is clear and moist.  Eyes: Pupils are equal, round, and reactive to light. Conjunctivae and EOM are normal. No scleral icterus.  Neck: Normal range of motion. Neck supple. No JVD present. No thyromegaly present.  Cardiovascular: Normal rate, regular rhythm and intact distal pulses. Exam reveals no gallop and no friction rub.  No murmur heard. Respiratory: Effort normal and breath sounds normal. No respiratory distress. She has no wheezes. She has no rales.  GI: Soft. Bowel sounds are normal. She exhibits no distension. There is no abdominal tenderness.  Musculoskeletal: Normal range of motion.        General: No edema.     Comments: No arthritis, no gout  Lymphadenopathy:    She has no cervical adenopathy.  Neurological: She is alert and oriented to person, place, and time. No cranial nerve deficit.  No dysarthria, no aphasia  Skin: Skin is warm and dry. No rash noted. No erythema.  Psychiatric: She has a normal mood and affect. Her behavior is normal. Judgment and thought content normal.    LABORATORY PANEL:   CBC Recent Labs  Lab 07/01/19 1617  WBC 11.4*  HGB 12.0  HCT 38.2  PLT 384    ------------------------------------------------------------------------------------------------------------------  Chemistries  Recent Labs  Lab 07/01/19 1617  NA 140  K 3.3*  CL 98  CO2 29  GLUCOSE 108*  BUN 15  CREATININE 0.62  CALCIUM 9.5  AST 24  ALT 15  ALKPHOS 42  BILITOT 0.5   ------------------------------------------------------------------------------------------------------------------  Cardiac Enzymes No results for input(s): TROPONINI in the last 168 hours. ------------------------------------------------------------------------------------------------------------------  RADIOLOGY:  Ct Abdomen Pelvis W Contrast  Result Date: 07/01/2019 CLINICAL DATA:  Abdominal pain and failure to thrive EXAM: CT ABDOMEN AND PELVIS WITH CONTRAST TECHNIQUE: Multidetector CT imaging of the abdomen and pelvis was performed using the standard protocol following bolus administration of intravenous contrast. CONTRAST:  68mL OMNIPAQUE IOHEXOL 300 MG/ML  SOLN COMPARISON:  10/07/2018 FINDINGS: Lower chest: No acute abnormality. Hepatobiliary: Fatty infiltration of the liver is noted. The gallbladder is unremarkable. Pancreas: Unremarkable. No pancreatic ductal dilatation or surrounding inflammatory changes. Spleen: Normal in size without focal abnormality. Adrenals/Urinary Tract: Adrenal glands are within normal limits. Kidneys are well visualize within normal enhancement pattern. No renal calculi are seen. A dominant cyst is again noted within the right kidney measuring approximately 5.4 cm. This is stable from the prior exam. Smaller lateral right renal cyst is noted as well stable from the prior study. Bladder is partially distended. Stomach/Bowel: The appendix is not visualized and may have been surgically removed. No inflammatory changes are seen. Scattered diverticular change of the colon is noted. The stomach and small bowel show no acute abnormality. Vascular/Lymphatic: Aortic  atherosclerosis. No enlarged abdominal or pelvic lymph nodes. Reproductive: Status post hysterectomy. No adnexal masses. Other: No abdominal wall hernia or abnormality. No abdominopelvic ascites. Musculoskeletal: Postsurgical changes in the proximal left femur are seen. Degenerative changes of the lumbar spine are noted. IMPRESSION: Chronic changes stable from the prior exam. No acute abnormality noted. Electronically Signed   By: Inez Catalina M.D.   On: 07/01/2019 20:37   Dg Chest Portable 1 View  Result Date: 07/01/2019 CLINICAL DATA:  Abdominal pain and weakness EXAM: PORTABLE CHEST 1 VIEW COMPARISON:  10/07/2018 FINDINGS: Cardiac shadow is within normal limits. Aortic calcifications are seen. Mild vascular congestion is noted with interstitial edema consistent with CHF. No focal infiltrate or sizable effusion is noted. No acute bony abnormality is seen. Postsurgical changes in the right axilla are again noted. IMPRESSION: Changes of mild CHF new from the prior exam. Electronically Signed   By: Inez Catalina M.D.   On: 07/01/2019 21:10    EKG:   Orders placed or performed during the hospital encounter of 07/01/19  . ED EKG  . ED EKG  . EKG 12-Lead  . EKG 12-Lead  . EKG 12-Lead  . EKG 12-Lead    IMPRESSION AND PLAN:  Principal Problem:   Pulmonary edema -no prior diagnosis of heart failure.  Some concern for the same given this edema as well as complaint of some recent weakness.  We will get an echocardiogram, and if she shows any abnormality there she will need a cardiology consult.  Otherwise might consider GI work-up for her chest pain. Active Problems:   HTN (hypertension) -continue home meds   Type 2 diabetes mellitus without complication, without long-term current use of insulin (HCC) -sliding scale insulin   Parkinson's disease ([place]) -continue home medication   GERD (gastroesophageal reflux disease) -home dose PPI  Chart review performed and case discussed with ED provider. Labs,  imaging and/or ECG reviewed by provider and discussed with patient/family. Management plans discussed with the patient and/or family.  COVID-19 status: Pending  DVT PROPHYLAXIS: SubQ lovenox   GI PROPHYLAXIS:  PPI   ADMISSION STATUS: Observation  CODE STATUS: Full Code Status History    Date Active Date Inactive Code Status Order ID Comments User Context   10/08/2018 0054 10/10/2018 2127 Full Code [id number]  Harvie Bridge, DO ED   Advance Care Planning Activity      TOTAL TIME TAKING CARE OF THIS PATIENT: 40 minutes.   This patient was evaluated in the context of the global COVID-19 pandemic, which necessitated consideration that the patient might be at risk for infection with the SARS-CoV-2 virus that causes COVID-19. Institutional protocols and algorithms that pertain to the evaluation of patients at risk for COVID-19 are in a state of rapid change based on information released by regulatory bodies including the CDC and federal and state organizations. These policies and algorithms were followed to the best of this provider's knowledge to date during the patient's care at this facility.  Ethlyn Daniels 07/01/2019, 10:31 PM  [facility]  Office  [phone]  CC: Primary care physician; Remi Haggard, FNP  Note:  This document was prepared using Dragon voice recognition software and may include unintentional dictation errors.

## 2019-07-01 NOTE — ED Triage Notes (Signed)
Pt also reports abd pain all the way across and feels bad.

## 2019-07-01 NOTE — ED Notes (Signed)
.. ED TO INPATIENT HANDOFF REPORT  ED Nurse Name and Phone #: Deneise Lever [phone]  S Name/Age/Gender Angela Savage 85 y.o. female Room/Bed: ED13A/ED13A  Code Status   Code Status: Prior  Home/SNF/Other Home Patient oriented to: self, place, time and situation Is this baseline? Yes   Triage Complete: Triage complete  Chief Complaint Failure to Thrive  EMS  Triage Note Pt in via EMS from home with c/o failure to thrive. Per EMS pt was seen by MD this am for bloodwork due to painful urination. EMS reports pt with pain all over due to cancer. EMS reports pt also with diabetes and parkinsons. BP 95% RA, 99HR, 150/90, FSBS 230, 100.5t, CO2 39, RR20.   Pt difficult to understand but states she does not feel well and is willing to do anything to get better.  Pt also reports abd pain all the way across and feels bad.    Allergies Allergies  Allergen Reactions  . Avandia [Rosiglitazone Maleate] Other (See Comments)    Rapid heart rate  . Carbidopa-Levodopa   . Ciprofloxacin     Heart problems   . Codeine   . Glucovance [Glyburide-Metformin] Other (See Comments)    Rapid heart beat  . Rosiglitazone Other (See Comments) and Palpitations    Chest pain.    Level of Care/Admitting Diagnosis ED Disposition    ED Disposition Condition [facility]: [facility] [100120]  Level of Care: Telemetry [5]  Covid Evaluation: Asymptomatic Screening Protocol (No Symptoms)  Diagnosis: Acute CHF (congestive heart failure) [facility]) [id number]  Admitting Physician: Lance Coon [id number]  Attending Physician: Lance Coon [id number]  Bed request comments: 2a  PT Class (Do Not Modify): Observation [104]  PT Acc Code (Do Not Modify): Observation [10022]       B Medical/Surgery History Past Medical History:  Diagnosis Date  . Allergy   . Anemia   . Anxiety and depression   . Breast cancer ([place])   . Cataracts, bilateral   . Diabetes mellitus   .  Diverticulitis   . Endometrial cancer ([place])   . GERD (gastroesophageal reflux disease)   . Hyperlipidemia   . Hypertension   . Parkinson's disease ([place])   . Peptic ulcer   . Tremor   . Umbilical hernia    Past Surgical History:  Procedure Laterality Date  . [place]   right, after lumpectomy  . Bladder operation     Tumor ?  Marland Kitchen BREAST LUMPECTOMY  1997   right  . COLONOSCOPY  2008   Internal hemorrhoids, Dr. Vira Agar  . TOTAL ABDOMINAL HYSTERECTOMY W/ BILATERAL SALPINGOOPHORECTOMY  1955   malignant tumor  . UMBILICAL HERNIA REPAIR  2007     A IV Location/Drains/Wounds Patient Lines/Drains/Airways Status   Active Line/Drains/Airways    Name:   Placement date:   Placement time:   Site:   Days:   Peripheral IV 07/01/19 Left Antecubital   07/01/19    2003    Antecubital   less than 1   External Urinary Catheter   10/08/18    0053    -   266          Intake/Output Last 24 hours No intake or output data in the 24 hours ending 07/01/19 2244  Labs/Imaging Results for orders placed or performed during the hospital encounter of 07/01/19 (from the past 48 hour(s))  Basic metabolic panel     Status: Abnormal   Collection  Time: 07/01/19  4:17 PM  Result Value Ref Range   Sodium 140 135 - 145 mmol/L   Potassium 3.3 (L) 3.5 - 5.1 mmol/L   Chloride 98 98 - 111 mmol/L   CO2 29 22 - 32 mmol/L   Glucose, Bld 108 (H) 70 - 99 mg/dL   BUN 15 8 - 23 mg/dL   Creatinine, Ser 0.62 0.44 - 1.00 mg/dL   Calcium 9.5 8.9 - 10.3 mg/dL   GFR calc non Af Amer >60 >60 mL/min   GFR calc Af Amer >60 >60 mL/min   Anion gap 13 5 - 15    Comment: Performed at [facility], [address] [place], [place] [address]  CBC     Status: Abnormal   Collection Time: 07/01/19  4:17 PM  Result Value Ref Range   WBC 11.4 (H) 4.0 - 10.5 K/uL   RBC 4.37 3.87 - 5.11 MIL/uL   Hemoglobin 12.0 12.0 - 15.0 g/dL   HCT 38.2 36.0 - 46.0 %   MCV 87.4 80.0 - 100.0 fL   MCH 27.5  26.0 - 34.0 pg   MCHC 31.4 30.0 - 36.0 g/dL   RDW 13.8 11.5 - 15.5 %   Platelets 384 150 - 400 K/uL   nRBC 0.0 0.0 - 0.2 %    Comment: Performed at [facility], [address] [place], [place] [address]  Lipase, blood     Status: None   Collection Time: 07/01/19  4:17 PM  Result Value Ref Range   Lipase 27 11 - 51 U/L    Comment: Performed at [facility], [address] [place], [place] [address]  Hepatic function panel     Status: None   Collection Time: 07/01/19  4:17 PM  Result Value Ref Range   Total Protein 7.3 6.5 - 8.1 g/dL   Albumin 3.6 3.5 - 5.0 g/dL   AST 24 15 - 41 U/L   ALT 15 0 - 44 U/L   Alkaline Phosphatase 42 38 - 126 U/L   Total Bilirubin 0.5 0.3 - 1.2 mg/dL   Bilirubin, Direct <0.1 0.0 - 0.2 mg/dL   Indirect Bilirubin NOT CALCULATED 0.3 - 0.9 mg/dL    Comment: Performed at [facility], [place] [place],  [address]  Urinalysis, Complete w Microscopic     Status: Abnormal   Collection Time: 07/01/19  8:00 PM  Result Value Ref Range   Color, Urine YELLOW (A) YELLOW   APPearance HAZY (A) CLEAR   Specific Gravity, Urine 1.013 1.005 - 1.030   pH 5.0 5.0 - 8.0   Glucose, UA NEGATIVE NEGATIVE mg/dL   Hgb urine dipstick SMALL (A) NEGATIVE   Bilirubin Urine NEGATIVE NEGATIVE   Ketones, ur NEGATIVE NEGATIVE mg/dL   Protein, ur 100 (A) NEGATIVE mg/dL   Nitrite NEGATIVE NEGATIVE   Leukocytes,Ua NEGATIVE NEGATIVE   RBC / HPF 0-5 0 - 5 RBC/hpf   WBC, UA 0-5 0 - 5 WBC/hpf   Bacteria, UA RARE (A) NONE SEEN   Squamous Epithelial / LPF NONE SEEN 0 - 5   Mucus PRESENT    Amorphous Crystal PRESENT     Comment: Performed at [facility], [address] [place], [place] [address]  Troponin I (High Sensitivity)     Status: None   Collection Time: 07/01/19  8:14 PM  Result Value Ref Range   Troponin I (High Sensitivity) 15 <18 ng/L    Comment: (NOTE) Elevated high sensitivity  troponin I (hsTnI) values and  significant  changes across serial measurements may suggest ACS but many other  chronic and acute conditions are known to elevate hsTnI results.  Refer to the "Links" section for chest pain algorithms and additional  guidance. Performed at [facility], [address] [place],  [address]    Ct Abdomen Pelvis W Contrast  Result Date: 07/01/2019 CLINICAL DATA:  Abdominal pain and failure to thrive EXAM: CT ABDOMEN AND PELVIS WITH CONTRAST TECHNIQUE: Multidetector CT imaging of the abdomen and pelvis was performed using the standard protocol following bolus administration of intravenous contrast. CONTRAST:  36mL OMNIPAQUE IOHEXOL 300 MG/ML  SOLN COMPARISON:  10/07/2018 FINDINGS: Lower chest: No acute abnormality. Hepatobiliary: Fatty infiltration of the liver is noted. The gallbladder is unremarkable. Pancreas: Unremarkable. No pancreatic ductal dilatation or surrounding inflammatory changes. Spleen: Normal in size without focal abnormality. Adrenals/Urinary Tract: Adrenal glands are within normal limits. Kidneys are well visualize within normal enhancement pattern. No renal calculi are seen. A dominant cyst is again noted within the right kidney measuring approximately 5.4 cm. This is stable from the prior exam. Smaller lateral right renal cyst is noted as well stable from the prior study. Bladder is partially distended. Stomach/Bowel: The appendix is not visualized and may have been surgically removed. No inflammatory changes are seen. Scattered diverticular change of the colon is noted. The stomach and small bowel show no acute abnormality. Vascular/Lymphatic: Aortic atherosclerosis. No enlarged abdominal or pelvic lymph nodes. Reproductive: Status post hysterectomy. No adnexal masses. Other: No abdominal wall hernia or abnormality. No abdominopelvic ascites. Musculoskeletal: Postsurgical changes in the proximal left femur are seen. Degenerative changes of the lumbar spine are noted.  IMPRESSION: Chronic changes stable from the prior exam. No acute abnormality noted. Electronically Signed   By: Inez Catalina M.D.   On: 07/01/2019 20:37   Dg Chest Portable 1 View  Result Date: 07/01/2019 CLINICAL DATA:  Abdominal pain and weakness EXAM: PORTABLE CHEST 1 VIEW COMPARISON:  10/07/2018 FINDINGS: Cardiac shadow is within normal limits. Aortic calcifications are seen. Mild vascular congestion is noted with interstitial edema consistent with CHF. No focal infiltrate or sizable effusion is noted. No acute bony abnormality is seen. Postsurgical changes in the right axilla are again noted. IMPRESSION: Changes of mild CHF new from the prior exam. Electronically Signed   By: Inez Catalina M.D.   On: 07/01/2019 21:10    Pending Labs Unresulted Labs (From admission, onward)    Start     Ordered   07/01/19 2017  SARS CORONAVIRUS 2 (TAT 6-24 HRS) Nasopharyngeal Nasopharyngeal Swab  (Asymptomatic/Tier 2 Patients Labs)  ONCE - STAT,   STAT    Question Answer Comment  Is this test for diagnosis or screening Screening   Symptomatic for COVID-19 as defined by CDC No   Hospitalized for COVID-19 No   Admitted to ICU for COVID-19 No   Previously tested for COVID-19 No   Resident in a congregate (group) care setting Unknown   Employed in healthcare setting Unknown   Pregnant No      07/01/19 2016   Signed and Held  Hemoglobin A1c  Once,   R    Comments: To assess prior glycemic control    Signed and Held   Signed and Held  CBC  (enoxaparin (LOVENOX)    CrCl >/= 30 ml/min)  Once,   R    Comments: Baseline for enoxaparin therapy IF NOT ALREADY DRAWN.  Notify MD if PLT < 100  K.    Signed and Held   Signed and Held  Creatinine, serum  (enoxaparin (LOVENOX)    CrCl >/= 30 ml/min)  Once,   R    Comments: Baseline for enoxaparin therapy IF NOT ALREADY DRAWN.    Signed and Held   Signed and Held  Creatinine, serum  (enoxaparin (LOVENOX)    CrCl >/= 30 ml/min)  Weekly,   R    Comments: while on  enoxaparin therapy    Signed and Held   Signed and Held  Basic metabolic panel  Tomorrow morning,   R     Signed and Held   Signed and Held  CBC  Tomorrow morning,   R     Signed and Held          Vitals/Pain Today's Vitals   07/01/19 1538 07/01/19 1539 07/01/19 2000 07/01/19 2117  BP: (!) 139/91  140/73   Pulse: (!) 103  94   Resp: 18  17 (!) 22  Temp: 99.1 F (37.3 C)     TempSrc: Oral     SpO2: 97%  97%   Weight:  44.5 kg    Height:  5' (1.524 m)    PainSc:  6       Isolation Precautions No active isolations  Medications Medications  sodium chloride 0.9 % bolus 500 mL (500 mLs Intravenous New Bag/Given 07/01/19 2114)  iohexol (OMNIPAQUE) 300 MG/ML solution 75 mL (75 mLs Intravenous Contrast Given 07/01/19 2024)    Mobility non-ambulatory Moderate fall risk   Focused Assessments    R Recommendations: See Admitting Provider Note  Report given to:   Additional Notes:

## 2019-07-01 NOTE — ED Provider Notes (Signed)
[facility] Emergency Department Provider Note    First MD Initiated Contact with Patient 07/01/19 1912     (approximate)  I have reviewed the triage vital signs and the nursing notes.   HISTORY  Chief Complaint Weakness and Abdominal Pain    HPI Angela Savage is a 85 y.o. female extensive past medical history as listed below presenting from home she lives independently with chief complaint of several weeks of progressively worsening abdominal pain as well as difficulty ambulating.  Does have history of Parkinson's.  She is having some burning when she has to pee.  Does have a history of diverticulitis.  Has not had any fevers.  Has had decreased appetite.  Denies any history of kidney stones..  Son at bedside is concerned that she has deteriorated to the point where she is no longer safe to be living independently but has noticed significant deterioration in her condition over the past several days.  States she also started complaining of shortness of breath as well as chest pain earlier today which was new and prompted the visit to the ER.   Past Medical History:  Diagnosis Date  . Allergy   . Anemia   . Anxiety and depression   . Breast cancer ([place])   . Cataracts, bilateral   . Diabetes mellitus   . Diverticulitis   . Endometrial cancer ([place])   . GERD (gastroesophageal reflux disease)   . Hyperlipidemia   . Hypertension   . Parkinson's disease ([place])   . Peptic ulcer   . Tremor   . Umbilical hernia    Family History  Problem Relation Age of Onset  . Colon cancer Maternal Aunt   . Breast cancer Daughter        sisters x3  . Ovarian cancer Sister   . Diabetes Father        children  . Stroke Mother   . Kidney disease Neg Hx   . Bladder Cancer Neg Hx   . Colon polyps Neg Hx    Past Surgical History:  Procedure Laterality Date  . [place]   right, after lumpectomy  . Bladder operation     Tumor ?  Marland Kitchen BREAST  LUMPECTOMY  1997   right  . COLONOSCOPY  2008   Internal hemorrhoids, Dr. Vira Agar  . TOTAL ABDOMINAL HYSTERECTOMY W/ BILATERAL SALPINGOOPHORECTOMY  1955   malignant tumor  . UMBILICAL HERNIA REPAIR  2007   Patient Active Problem List   Diagnosis Date Noted  . Parkinson's disease ([place]) 10/10/2018  . Weakness 10/07/2018  . Closed intertrochanteric fracture ([place]) 01/15/2018  . Disorder of bursae of shoulder region 01/15/2018  . Osteoarthritis of knee 01/15/2018  . History of breast cancer in female 12/10/2017  . HTN (hypertension) 09/21/2017  . Elevated hemoglobin A1c 09/21/2017  . 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 Hx of fracture of left hip 12/06/2015  . Benign essential hypertension 12/06/2015  . Type 2 diabetes mellitus without complication, without long-term current use of insulin ([place]) 12/06/2015  . Chronic UTI 10/27/2015  . Gross hematuria 10/27/2015  . Atrophic vaginitis 10/27/2015  . Dysfunctional gallbladder 07/06/2015  . CN (constipation) 01/06/2015  . H/O disease 01/06/2015  . Abdominal pain, left lower quadrant 01/06/2015  . Abdominal pain, right upper quadrant 01/06/2015  . H/O acute pancreatitis 01/06/2015      Prior to Admission medications   Medication Sig Start Date End Date Taking? Authorizing Provider  acetaminophen (TYLENOL) 325  MG tablet Take 650 mg by mouth every 4 (four) hours as needed.     [provider]  ALPRAZolam Duanne Moron) 1 MG tablet Take 1 tablet (1 mg total) by mouth 2 (two) times daily. 10/10/18   Salary, Avel Peace, MD  Alum Hydroxide-Mag Carbonate (GAVISCON PO) Take 1 tablet by mouth 3 (three) times daily before meals.    [provider]  aspirin 81 MG tablet Take 81 mg by mouth daily.    [provider]  bismuth subsalicylate (PEPTO BISMOL) 262 MG/15ML suspension Take by mouth as needed.    [provider]  Cholecalciferol (VITAMIN D3) 5000 units CAPS Take by mouth. Take 1 capsule 3 times a week     [provider]  feeding supplement, ENSURE ENLIVE, (ENSURE ENLIVE) LIQD Take 237 mLs by mouth 2 (two) times daily between meals. 10/10/18   Salary, Avel Peace, MD  fluticasone (FLONASE) 50 MCG/ACT nasal spray Place 2 sprays into both nostrils daily.  08/26/15   [provider]  glucose blood test strip Accu-Chek Compact Test strips    [provider]  hydrochlorothiazide 25 MG tablet Take 25 mg by mouth every morning.      [provider]  LANTUS SOLOSTAR 100 UNIT/ML Solostar Pen Inject 14 Units into the skin daily at 12 noon.  08/11/15   [provider]  metFORMIN (GLUCOPHAGE) 1000 MG tablet Take 1,000 mg by mouth 2 (two) times daily with a meal.      [provider]  metoprolol succinate (TOPROL-XL) 50 MG 24 hr tablet  11/08/15   [provider]  Multiple Vitamin (MULTIVITAMIN WITH MINERALS) TABS tablet Take 1 tablet by mouth daily. 10/11/18   Salary, Avel Peace, MD  potassium chloride SA (K-DUR,KLOR-CON) 20 MEQ tablet Take 20 mEq by mouth daily.     [provider]  pramipexole (MIRAPEX) 1.5 MG tablet Take 1.5 mg by mouth daily.     [provider]  Pumpkin Seed-Soy Germ (AZO BLADDER CONTROL/GO-LESS PO) Take 1 tablet by mouth 2 (two) times a week.     [provider]    Allergies Avandia [rosiglitazone maleate], Carbidopa-levodopa, Ciprofloxacin, Codeine, Glucovance [glyburide-metformin], and Rosiglitazone    Social History Social History   Tobacco Use  . Smoking status: Never Smoker  . Smokeless tobacco: Never Used  Substance Use Topics  . Alcohol use: No  . Drug use: No    Review of Systems Patient denies headaches, rhinorrhea, blurry vision, numbness, shortness of breath, chest pain, edema, cough, abdominal pain, nausea, vomiting, diarrhea, dysuria, fevers, rashes or hallucinations unless otherwise stated above in HPI. ____________________________________________   PHYSICAL EXAM:  VITAL SIGNS:  Vitals:   07/01/19 1538 07/01/19 2000  BP: (!) 139/91 140/73  Pulse: (!) 103 94  Resp: 18 17  Temp: 99.1 F (37.3 C)   SpO2: 97% 97%    Constitutional: Alert and oriented.  Eyes: Conjunctivae are normal.  Head: Atraumatic. Nose: No congestion/rhinnorhea. Mouth/Throat: Mucous membranes are moist.   Neck: No stridor. Painless ROM.  Cardiovascular: Normal rate, regular rhythm. Grossly normal heart sounds.  Good peripheral circulation. Respiratory: Normal respiratory effort.  No retractions. Lungs CTAB. Gastrointestinal: Soft with mild ttp in LLQ. No distention.   No CVA tenderness. Genitourinary: deferred Musculoskeletal: No lower extremity tenderness nor edema.  No joint effusions. Neurologic:  Normal speech and language. Resting tremor BUE Skin:  Skin is warm, dry and intact. No rash noted. Psychiatric:  Speech and behavior are normal.  ____________________________________________  LABS (all labs ordered are listed, but only abnormal results are displayed)  Results for orders placed or performed during the hospital encounter of 07/01/19 (from the past 24 hour(s))  Basic metabolic panel     Status: Abnormal   Collection Time: 07/01/19  4:17 PM  Result Value Ref Range   Sodium 140 135 - 145 mmol/L   Potassium 3.3 (L) 3.5 - 5.1 mmol/L   Chloride 98 98 - 111 mmol/L   CO2 29 22 - 32 mmol/L   Glucose, Bld 108 (H) 70 - 99 mg/dL   BUN 15 8 - 23 mg/dL   Creatinine, Ser 0.62 0.44 - 1.00 mg/dL   Calcium 9.5 8.9 - 10.3 mg/dL   GFR calc non Af Amer >60 >60 mL/min   GFR calc Af Amer >60 >60 mL/min   Anion gap 13 5 - 15  CBC     Status: Abnormal   Collection Time: 07/01/19  4:17 PM  Result Value Ref Range   WBC 11.4 (H) 4.0 - 10.5 K/uL   RBC 4.37 3.87 - 5.11 MIL/uL   Hemoglobin 12.0 12.0 - 15.0 g/dL   HCT 38.2 36.0 - 46.0 %   MCV 87.4 80.0 - 100.0 fL   MCH 27.5 26.0 - 34.0 pg   MCHC 31.4 30.0 - 36.0 g/dL   RDW 13.8 11.5 - 15.5 %   Platelets 384 150 - 400 K/uL   nRBC 0.0  0.0 - 0.2 %   ____________________________________________  EKG My review and personal interpretation at Time: 15:58   Indication: chest pain  Rate: 100  Rhythm: sinus Axis: normal Other: nonspecific st abn, artifact 2/2 tremor ____________________________________________  RADIOLOGY   ____________________________________________   PROCEDURES  Procedure(s) performed:  Procedures    Critical Care performed: no ____________________________________________   INITIAL IMPRESSION / ASSESSMENT AND PLAN / ED COURSE  Pertinent labs & imaging results that were available during my care of the patient were reviewed by me and considered in my medical decision making (see chart for details).   DDX: Diverticulitis, mass, cystitis, [place], stone, fracture, dehydration, electrolyte abnormality, deconditioning  Sieanna Fatta is a 85 y.o. who presents to the ED with symptoms as described above.  Patient chronically ill-appearing frail but in no acute respiratory distress.  Based on her exam and presentation do feel that additional blood work as well as CT imaging will be indicated.  Patient be signed out to oncoming physician, Dr. Jari Pigg pending imaging and reassessment.     The patient was evaluated in Emergency Department today for the symptoms described in the history of present illness. He/she was evaluated in the context of the global COVID-19 pandemic, which necessitated consideration that the patient might be at risk for infection with the SARS-CoV-2 virus that causes COVID-19. Institutional protocols and algorithms that pertain to the evaluation of patients at risk for COVID-19 are in a state of rapid change based on information released by regulatory bodies including the CDC and federal and state organizations. These policies and algorithms were followed during the patient's care in the ED.  As part of my medical decision making, I reviewed the following data within the electronic medical  record:  Nursing notes reviewed and incorporated, Labs reviewed, notes from prior ED visits and [place] Controlled Substance Database   ____________________________________________   FINAL CLINICAL IMPRESSION(S) / ED DIAGNOSES  Final diagnoses:  Left lower quadrant abdominal pain  Weakness      NEW MEDICATIONS STARTED DURING THIS VISIT:  New Prescriptions  No medications on file     Note:  This document was prepared using Dragon voice recognition software and may include unintentional dictation errors.    Merlyn Lot, MD 07/01/19 2016

## 2019-07-01 NOTE — ED Notes (Signed)
This RN attempted to call report.  

## 2019-07-01 NOTE — ED Provider Notes (Signed)
8:41 PM Assumed care for off going team.   Blood pressure 140/73, pulse 94, temperature 99.1 F (37.3 C), temperature source Oral, resp. rate 17, height 5' (1.524 m), weight 44.5 kg, SpO2 97 %.  See their HPI for full report but in brief patient has been living at home alone and came in today for chest pain.  EKG was unchanged however she had a troponin of 15 and chest x-ray is concerning for new pulmonary edema.  Patient is unable to care for self at home.  Discussed with the hospital team for admission.         Vanessa Greenbriar, MD 07/01/19 2201

## 2019-07-01 NOTE — ED Notes (Signed)
Pt at CT

## 2019-07-01 NOTE — ED Triage Notes (Signed)
Pt in via EMS from home with c/o failure to thrive. Per EMS pt was seen by MD this am for bloodwork due to painful urination. EMS reports pt with pain all over due to cancer. EMS reports pt also with diabetes and parkinsons. BP 95% RA, 99HR, 150/90, FSBS 230, 100.5t, CO2 39, RR20.

## 2019-07-02 ENCOUNTER — Encounter: Payer: Self-pay | Admitting: *Deleted

## 2019-07-02 ENCOUNTER — Observation Stay (HOSPITAL_COMMUNITY)
Admit: 2019-07-02 | Discharge: 2019-07-02 | Disposition: A | Discharge status: Home / Self Care | Payer: Medicare Other | Attending: Internal Medicine | Admitting: Internal Medicine

## 2019-07-02 DIAGNOSIS — I110 Hypertensive heart disease with heart failure: Secondary | ICD-10-CM | POA: Diagnosis present

## 2019-07-02 DIAGNOSIS — I509 Heart failure, unspecified: Secondary | ICD-10-CM | POA: Diagnosis not present

## 2019-07-02 DIAGNOSIS — Z515 Encounter for palliative care: Secondary | ICD-10-CM | POA: Diagnosis not present

## 2019-07-02 DIAGNOSIS — Z8542 Personal history of malignant neoplasm of other parts of uterus: Secondary | ICD-10-CM | POA: Diagnosis not present

## 2019-07-02 DIAGNOSIS — E43 Unspecified severe protein-calorie malnutrition: Secondary | ICD-10-CM | POA: Diagnosis present

## 2019-07-02 DIAGNOSIS — Z833 Family history of diabetes mellitus: Secondary | ICD-10-CM | POA: Diagnosis not present

## 2019-07-02 DIAGNOSIS — Z681 Body mass index (BMI) 19 or less, adult: Secondary | ICD-10-CM | POA: Diagnosis not present

## 2019-07-02 DIAGNOSIS — G9341 Metabolic encephalopathy: Secondary | ICD-10-CM | POA: Diagnosis not present

## 2019-07-02 DIAGNOSIS — E785 Hyperlipidemia, unspecified: Secondary | ICD-10-CM | POA: Diagnosis present

## 2019-07-02 DIAGNOSIS — Z79899 Other long term (current) drug therapy: Secondary | ICD-10-CM | POA: Diagnosis not present

## 2019-07-02 DIAGNOSIS — K219 Gastro-esophageal reflux disease without esophagitis: Secondary | ICD-10-CM | POA: Diagnosis present

## 2019-07-02 DIAGNOSIS — I5033 Acute on chronic diastolic (congestive) heart failure: Secondary | ICD-10-CM | POA: Diagnosis present

## 2019-07-02 DIAGNOSIS — Z794 Long term (current) use of insulin: Secondary | ICD-10-CM | POA: Diagnosis not present

## 2019-07-02 DIAGNOSIS — Z853 Personal history of malignant neoplasm of breast: Secondary | ICD-10-CM | POA: Diagnosis not present

## 2019-07-02 DIAGNOSIS — I10 Essential (primary) hypertension: Secondary | ICD-10-CM | POA: Diagnosis not present

## 2019-07-02 DIAGNOSIS — R1032 Left lower quadrant pain: Secondary | ICD-10-CM | POA: Diagnosis present

## 2019-07-02 DIAGNOSIS — Z803 Family history of malignant neoplasm of breast: Secondary | ICD-10-CM | POA: Diagnosis not present

## 2019-07-02 DIAGNOSIS — Z881 Allergy status to other antibiotic agents status: Secondary | ICD-10-CM | POA: Diagnosis not present

## 2019-07-02 DIAGNOSIS — Z20828 Contact with and (suspected) exposure to other viral communicable diseases: Secondary | ICD-10-CM | POA: Diagnosis present

## 2019-07-02 DIAGNOSIS — Z823 Family history of stroke: Secondary | ICD-10-CM | POA: Diagnosis not present

## 2019-07-02 DIAGNOSIS — G20 Parkinson's disease: Secondary | ICD-10-CM | POA: Diagnosis present

## 2019-07-02 DIAGNOSIS — Z7982 Long term (current) use of aspirin: Secondary | ICD-10-CM | POA: Diagnosis not present

## 2019-07-02 DIAGNOSIS — Z8041 Family history of malignant neoplasm of ovary: Secondary | ICD-10-CM | POA: Diagnosis not present

## 2019-07-02 DIAGNOSIS — Z7189 Other specified counseling: Secondary | ICD-10-CM | POA: Diagnosis not present

## 2019-07-02 DIAGNOSIS — I4892 Unspecified atrial flutter: Secondary | ICD-10-CM | POA: Diagnosis not present

## 2019-07-02 DIAGNOSIS — Z885 Allergy status to narcotic agent status: Secondary | ICD-10-CM | POA: Diagnosis not present

## 2019-07-02 DIAGNOSIS — F05 Delirium due to known physiological condition: Secondary | ICD-10-CM | POA: Diagnosis not present

## 2019-07-02 DIAGNOSIS — J810 Acute pulmonary edema: Secondary | ICD-10-CM | POA: Diagnosis not present

## 2019-07-02 DIAGNOSIS — Z888 Allergy status to other drugs, medicaments and biological substances status: Secondary | ICD-10-CM | POA: Diagnosis not present

## 2019-07-02 DIAGNOSIS — Z800 Family history of malignant neoplasm of digestive organs: Secondary | ICD-10-CM | POA: Diagnosis not present

## 2019-07-02 DIAGNOSIS — L8990 Pressure ulcer of unspecified site, unspecified stage: Secondary | ICD-10-CM | POA: Insufficient documentation

## 2019-07-02 LAB — BASIC METABOLIC PANEL
Anion gap: 9 (ref 5–15)
BUN: 10 mg/dL (ref 8–23)
CO2: 29 mmol/L (ref 22–32)
Calcium: 8.7 mg/dL — ABNORMAL LOW (ref 8.9–10.3)
Chloride: 103 mmol/L (ref 98–111)
Creatinine, Ser: 0.49 mg/dL (ref 0.44–1.00)
GFR calc Af Amer: >60 mL/min (ref >60)
GFR calc non Af Amer: >60 mL/min (ref >60)
Glucose, Bld: 141 mg/dL — ABNORMAL HIGH (ref 70–99)
Potassium: 2.9 mmol/L — ABNORMAL LOW (ref 3.5–5.1)
Sodium: 141 mmol/L (ref 135–145)

## 2019-07-02 LAB — GLUCOSE, CAPILLARY
Glucose-Capillary: 88 mg/dL (ref 70–99)
Glucose-Capillary: 286 mg/dL — ABNORMAL HIGH (ref 70–99)
Glucose-Capillary: 162 mg/dL — ABNORMAL HIGH (ref 70–99)
Glucose-Capillary: 59 mg/dL — ABNORMAL LOW (ref 70–99)
Glucose-Capillary: 79 mg/dL (ref 70–99)
Glucose-Capillary: 112 mg/dL — ABNORMAL HIGH (ref 70–99)

## 2019-07-02 LAB — CBC
HCT: 33.8 % — ABNORMAL LOW (ref 36.0–46.0)
Hemoglobin: 10.7 g/dL — ABNORMAL LOW (ref 12.0–15.0)
MCH: 27.5 pg (ref 26.0–34.0)
MCHC: 31.7 g/dL (ref 30.0–36.0)
MCV: 86.9 fL (ref 80.0–100.0)
Platelets: 317 10*3/uL (ref 150–400)
RBC: 3.89 MIL/uL (ref 3.87–5.11)
RDW: 13.9 % (ref 11.5–15.5)
WBC: 8.7 10*3/uL (ref 4.0–10.5)
nRBC: 0 % (ref 0.0–0.2)

## 2019-07-02 LAB — TROPONIN I (HIGH SENSITIVITY): Troponin I (High Sensitivity): 18 ng/L — ABNORMAL HIGH (ref <18)

## 2019-07-02 LAB — ECHOCARDIOGRAM COMPLETE
Height: 60 in
Weight: 1624 oz

## 2019-07-02 LAB — HEMOGLOBIN A1C
Hgb A1c MFr Bld: 6.2 % — ABNORMAL HIGH (ref 4.8–5.6)
Mean Plasma Glucose: 131.24 mg/dL

## 2019-07-02 LAB — POTASSIUM: Potassium: 3.6 mmol/L (ref 3.5–5.1)

## 2019-07-02 LAB — SARS CORONAVIRUS 2 (TAT 6-24 HRS): SARS Coronavirus 2: NEGATIVE

## 2019-07-02 MED ORDER — POTASSIUM CHLORIDE 10 MEQ/100ML IV SOLN
10.0000 meq | INTRAVENOUS | Status: DC
  Administered 2019-07-02: 10 meq via INTRAVENOUS
  Filled 2019-07-02 (×6): qty 100

## 2019-07-02 MED ORDER — SODIUM CHLORIDE 0.9 % IV SOLN
INTRAVENOUS | Status: DC | PRN
  Administered 2019-07-02: 250 mL via INTRAVENOUS

## 2019-07-02 MED ORDER — POTASSIUM CHLORIDE CRYS ER 20 MEQ PO TBCR
40.0000 meq | EXTENDED_RELEASE_TABLET | ORAL | Status: AC
  Administered 2019-07-02 (×2): 40 meq via ORAL
  Filled 2019-07-02 (×2): qty 2

## 2019-07-02 MED ORDER — ADULT MULTIVITAMIN W/MINERALS CH
1.0000 | ORAL_TABLET | Freq: Every day | ORAL | Status: DC
  Administered 2019-07-03 – 2019-07-10 (×8): 1 via ORAL
  Filled 2019-07-02 (×8): qty 1

## 2019-07-02 MED ORDER — METOPROLOL SUCCINATE ER 50 MG PO TB24
50.0000 mg | ORAL_TABLET | Freq: Every day | ORAL | Status: DC
  Administered 2019-07-02 – 2019-07-04 (×3): 50 mg via ORAL
  Filled 2019-07-02 (×3): qty 1

## 2019-07-02 MED ORDER — SUCRALFATE 1 G PO TABS
1.0000 g | ORAL_TABLET | Freq: Four times a day (QID) | ORAL | Status: DC
  Administered 2019-07-02 – 2019-07-10 (×27): 1 g via ORAL
  Filled 2019-07-02 (×30): qty 1

## 2019-07-02 MED ORDER — ALPRAZOLAM 0.5 MG PO TABS
0.5000 mg | ORAL_TABLET | Freq: Four times a day (QID) | ORAL | Status: DC
  Administered 2019-07-02 – 2019-07-10 (×29): 0.5 mg via ORAL
  Filled 2019-07-02 (×31): qty 1

## 2019-07-02 MED ORDER — PRAMIPEXOLE DIHYDROCHLORIDE 1 MG PO TABS
1.5000 mg | ORAL_TABLET | Freq: Every day | ORAL | Status: DC
  Administered 2019-07-03: 1.5 mg via ORAL
  Filled 2019-07-02 (×2): qty 2

## 2019-07-02 MED ORDER — VITAMIN C 500 MG PO TABS
250.0000 mg | ORAL_TABLET | Freq: Two times a day (BID) | ORAL | Status: DC
  Administered 2019-07-02 – 2019-07-10 (×15): 250 mg via ORAL
  Filled 2019-07-02 (×15): qty 1

## 2019-07-02 MED ORDER — GLUCERNA SHAKE PO LIQD
237.0000 mL | Freq: Three times a day (TID) | ORAL | Status: DC
  Administered 2019-07-02 – 2019-07-10 (×17): 237 mL via ORAL

## 2019-07-02 MED ORDER — ALPRAZOLAM 0.5 MG PO TABS
0.5000 mg | ORAL_TABLET | Freq: Once | ORAL | Status: AC
  Administered 2019-07-02: 0.5 mg via ORAL
  Filled 2019-07-02: qty 1

## 2019-07-02 MED ORDER — CALCIUM CARBONATE ANTACID 500 MG PO CHEW
1.0000 | CHEWABLE_TABLET | Freq: Three times a day (TID) | ORAL | Status: DC | PRN
  Administered 2019-07-02: 200 mg via ORAL
  Filled 2019-07-02: qty 1

## 2019-07-02 MED ORDER — FUROSEMIDE 10 MG/ML IJ SOLN
20.0000 mg | Freq: Once | INTRAMUSCULAR | Status: AC
  Administered 2019-07-02: 10:00:00 20 mg via INTRAVENOUS
  Filled 2019-07-02: qty 2

## 2019-07-02 NOTE — Evaluation (Addendum)
Clinical/Bedside Swallow Evaluation Patient Details  Name: Angela Savage MRN: [id number] Date of Birth: 1932-06-08  Today's Date: 07/02/2019 Time: SLP Start Time (ACUTE ONLY): 1500 SLP Stop Time (ACUTE ONLY): 1600 SLP Time Calculation (min) (ACUTE ONLY): 60 min  Past Medical History:  Past Medical History:  Diagnosis Date  . Allergy   . Anemia   . Anxiety and depression   . Breast cancer ([place])   . Cataracts, bilateral   . Diabetes mellitus   . Diverticulitis   . Endometrial cancer ([place])   . GERD (gastroesophageal reflux disease)   . Hyperlipidemia   . Hypertension   . Parkinson's disease ([place])   . Peptic ulcer   . Tremor   . Umbilical hernia    Past Surgical History:  Past Surgical History:  Procedure Laterality Date  . [place]   right, after lumpectomy  . Bladder operation     Tumor ?  Marland Kitchen BREAST LUMPECTOMY  1997   right  . COLONOSCOPY  2008   Internal hemorrhoids, Dr. Vira Agar  . TOTAL ABDOMINAL HYSTERECTOMY W/ BILATERAL SALPINGOOPHORECTOMY  1955   malignant tumor  . UMBILICAL HERNIA REPAIR  2007   HPI:  Pt is a 85 y.o. female w/ extensive past medical history as listed below including GERD and Parkinson's Dis. presenting from home she lives alone w/ Son coming to check on her and assist w/ ADLs with chief complaint of several weeks of progressively worsening abdominal pain as well as difficulty ambulating.  Does have history of Parkinson's and Endometrial Cancer per chart notes.  She is having some burning when she has to pee.  Does have a history of diverticulitis.  Has not had any fevers.  Has had decreased appetite -- eats only 4-5 foods items per Son's and her report.  Pt also has LOOSE fitting Dentures.  Son at bedside is concerned that she has deteriorated to the point where she is no longer safe to be living independently but has noticed significant deterioration in her condition over the past several days.  States she also started  complaining of shortness of breath as well as chest pain earlier today which was new and prompted the visit to the ER.  Per EMS, pt is from home with c/o failure to thrive. Pt with pain all over due to Cancer. BP 95% RA, 99HR, 150/90   Assessment / Plan / Recommendation Clinical Impression  Pt appears to present w/ Oral phase dysphagia impacted by increased oral-motor tremors secondary to Parkinson's Dis. and ILL-FITTING DENTURES hampering mastication of increased texture. Pt also c/o lack of Appetite or desire for foods in general except for 4-5 noted foods she typically eats. Pt tends to Drink various Fluids in her diet: water, Diet Dr. Malachi Bonds, Glucerna(1x daily). Any oral phase deficits and a progressive neurological disease (PD) can increase risk for pharyngeal phase deficits thus increase risk for aspiration and Pulmonary decline. Pt has been dx'd w/ Severe Malnutrition and FTT per chart notes in light of chronic illnesses including Endometrial Ca and Parkinson's Dis. per chart. Pt is also hampered by the fact that she CANNOT feed herself and requires assistance when someone comes to the house -- noted this to be true during this evaluation.   During po trials, pt consumed sips of thin liquids, purees, and broken down solids in a puree to moisten. NO overt s/s of aspiration noted w/ these trials; no decline in vocal quality or respiratory effort during/post trials. Oral phase was  grossly [facility] for bolus management and timely A-P transfer w/ trials of thin liquids and purees; increased mastication effort/time needed for the increased textured food trials. Given time, pt cleared -- suspect impact from the Ill-Fitting Lower Denture plate. OM exam was [facility] for lingual/labial strength/ROM; increased lingual and madible tremors noted at rest and during volitional task. Of note, was a slight amount of increased saliva and drooling while pt talked (frequently) and post trials of solid foods. Suspect this is d/t pt's  Ill-Fitting Lower Denture plate trapping saliva and potential debris under the plate and pt's decreased awareness of the saliva. Pt was instructed to use a lingual sweep b/t food/liquid trials, and when talking intermittently, to clear mouth of any extra wetness, saliva. Pt was able to follow throught w/ this w/ verbal cues, model.  Recommend a Dysphagia level 2-3 diet w/ MORE PUREED FOODS w/ Thin liquids; all foods well-broken down and moistened well d/t poor mastication ability secondary to the loose-fitting lower denture plate; aspiration precautions; Pills In PUREE for safer swallowing. Feeding Support at all meals - pt is too Weak and has reduced Coordination to self-feed at this time. She may benefit from using her Weighted Utensils Son stated are at home when she returns to self-feeding. Recommend trying to secure the LOOSE Dentures w/ Adhesive or move to an all-puree diet consistency.  SLP Visit Diagnosis: Dysphagia, oral phase (R13.11)(baseline Parkinson's Dis)    Aspiration Risk  Mild aspiration risk;Risk for inadequate nutrition/hydration(reduced when following aspiration precautions)    Diet Recommendation  Dysphagia level 2-3(meats Minced w/ gravies added); Thin liquids. General aspiration precautions; Reflux precautions d/t GERD baseline. Needs LOOSE Dentures secured w/ adhesive.   Medication Administration: Crushed with puree(for safer swallowing)    Other  Recommendations Recommended Consults: (Dietician f/u; Palliative Care consult for [place]) Oral Care Recommendations: Oral care BID;Staff/trained caregiver to provide oral care(try to secure Dentures w/ adhesive) Other Recommendations: (n/a)   Follow up Recommendations None. Education may be needed w/ pt on the importance of not wearing loose-fitting dentures attempting to eat solid foods; pt may need to move to more of a PUREED diet consistency altogether. Recommend Palliative Care f/u for GOC.      Frequency and Duration (n/a)   (n/a)       Prognosis Prognosis for Safe Diet Advancement: Fair Barriers to Reach Goals: Time post onset;Severity of deficits;Behavior(FTT)      Swallow Study   General Date of Onset: 07/01/19 HPI: Pt is a 85 y.o. female w/ extensive past medical history as listed below including GERD and Parkinson's Dis. presenting from home she lives alone w/ Son coming to check on her and assist w/ ADLs with chief complaint of several weeks of progressively worsening abdominal pain as well as difficulty ambulating.  Does have history of Parkinson's and Endometrial Cancer per chart notes.  She is having some burning when she has to pee.  Does have a history of diverticulitis.  Has not had any fevers.  Has had decreased appetite -- eats only 4-5 foods items per Son's and her report.  Pt also has LOOSE fitting Dentures.  Son at bedside is concerned that she has deteriorated to the point where she is no longer safe to be living independently but has noticed significant deterioration in her condition over the past several days.  States she also started complaining of shortness of breath as well as chest pain earlier today which was new and prompted the visit to the ER.  Per EMS,  pt is from home with c/o failure to thrive. Pt with pain all over due to Cancer. BP 95% RA, 99HR, 150/90 Type of Study: Bedside Swallow Evaluation Previous Swallow Assessment: none Diet Prior to this Study: Regular;Thin liquids Temperature Spikes Noted: No(wbc 8.7) Respiratory Status: Room air History of Recent Intubation: No Behavior/Cognition: Alert;Cooperative;Pleasant mood;Distractible;Requires cueing(mild confusion during engagement; min HOH) Oral Cavity Assessment: Within Functional Limits Oral Care Completed by SLP: Yes Oral Cavity - Dentition: Dentures, top;Dentures, bottom(LOOSE fitting) Vision: Functional for self-feeding Self-Feeding Abilities: Total assist(cannot feed self d/t weakness, tremors) Patient Positioning:  Upright in bed(needed support) Baseline Vocal Quality: Normal Volitional Cough: Strong(-Fair) Volitional Swallow: Able to elicit    Oral/Motor/Sensory Function Overall Oral Motor/Sensory Function: Mild impairment(oral/mandible Tremors - PD) Facial ROM: Within Functional Limits Facial Symmetry: Within Functional Limits Lingual ROM: Within Functional Limits Lingual Symmetry: Within Functional Limits Lingual Strength: Within Functional Limits Velum: Within Functional Limits Mandible: Impaired(tremors)   Ice Chips Ice chips: Not tested   Thin Liquid Thin Liquid: Within functional limits Presentation: Self Fed;Straw(~3-4 ozs total) Other Comments: pt talked constantly during po trials    Nectar Thick Nectar Thick Liquid: Not tested   Honey Thick Honey Thick Liquid: Not tested   Puree Puree: Within functional limits Presentation: Spoon(fed; 6 trials) Other Comments: pt talked constantly during trials   Solid     Solid: Impaired Presentation: Spoon(fed; 5 trials) Oral Phase Impairments: Impaired mastication Oral Phase Functional Implications: Impaired mastication;Prolonged oral transit Pharyngeal Phase Impairments: (none) Other Comments: pt talked constantly during po trials       Orinda Kenner, MS, CCC-SLP Watson,Katherine 07/02/2019,4:50 PM

## 2019-07-02 NOTE — Progress Notes (Signed)
[facility] [place] NAME: Yailyn Makowski    MR#:  [id number]  DATE OF BIRTH:  07-10-1932  SUBJECTIVE:   Patient states she is still having a lot of weakness today.  She denies any shortness of breath, cough, or lower extremity edema.  She is not having any chest pain.  REVIEW OF SYSTEMS:  Review of Systems  Constitutional: Positive for malaise/fatigue. Negative for chills and fever.  HENT: Negative for congestion and sore throat.   Eyes: Negative for blurred vision and double vision.  Respiratory: Negative for cough and shortness of breath.   Cardiovascular: Negative for chest pain and palpitations.  Gastrointestinal: Negative for nausea and vomiting.  Genitourinary: Negative for dysuria and urgency.  Musculoskeletal: Negative for back pain and neck pain.  Neurological: Positive for tremors and weakness. Negative for dizziness, focal weakness and headaches.  Psychiatric/Behavioral: Negative for depression. The patient is not nervous/anxious.     DRUG ALLERGIES:   Allergies  Allergen Reactions   Avandia [Rosiglitazone Maleate] Other (See Comments)    Rapid heart rate   Carbidopa-Levodopa    Ciprofloxacin     Heart problems    Codeine    Glucovance [Glyburide-Metformin] Other (See Comments)    Rapid heart beat   Rosiglitazone Other (See Comments) and Palpitations    Chest pain.   VITALS:  Blood pressure (!) 135/97, pulse 86, temperature 98.5 F (36.9 C), temperature source Oral, resp. rate 18, height 5' (1.524 m), weight 46 kg, SpO2 95 %. PHYSICAL EXAMINATION:  Physical Exam  GENERAL:  Laying in the bed with no acute distress. Thin and chronically ill-appearing. HEENT: Head atraumatic, normocephalic. Pupils equal, round, reactive to light and accommodation. No scleral icterus. Extraocular muscles intact. Oropharynx and nasopharynx clear.  NECK:  Supple, no jugular venous distention. No thyroid enlargement. LUNGS:  Lungs are clear to auscultation bilaterally. No wheezes, crackles, rhonchi. No use of accessory muscles of respiration.  CARDIOVASCULAR: S1, S2 normal. No murmurs, rubs, or gallops.  ABDOMEN: Soft, nontender, nondistended. Bowel sounds present.  EXTREMITIES: No pedal edema, cyanosis, or clubbing.  NEUROLOGIC: CN 2-12 intact, no focal deficits. 5/5 muscle strength throughout all extremities. Sensation intact throughout. Gait not checked. +upper extremity tremors PSYCHIATRIC: The patient is alert and oriented x 3.  SKIN: No obvious rash, lesion, or ulcer.  LABORATORY PANEL:  Female CBC Recent Labs  Lab 07/02/19 0314  WBC 8.7  HGB 10.7*  HCT 33.8*  PLT 317   ------------------------------------------------------------------------------------------------------------------ Chemistries  Recent Labs  Lab 07/01/19 1617 07/02/19 0314  NA 140 141  K 3.3* 2.9*  CL 98 103  CO2 29 29  GLUCOSE 108* 141*  BUN 15 10  CREATININE 0.62 0.49  CALCIUM 9.5 8.7*  AST 24  --   ALT 15  --   ALKPHOS 42  --   BILITOT 0.5  --    RADIOLOGY:  Ct Abdomen Pelvis W Contrast  Result Date: 07/01/2019 CLINICAL DATA:  Abdominal pain and failure to thrive EXAM: CT ABDOMEN AND PELVIS WITH CONTRAST TECHNIQUE: Multidetector CT imaging of the abdomen and pelvis was performed using the standard protocol following bolus administration of intravenous contrast. CONTRAST:  21mL OMNIPAQUE IOHEXOL 300 MG/ML  SOLN COMPARISON:  10/07/2018 FINDINGS: Lower chest: No acute abnormality. Hepatobiliary: Fatty infiltration of the liver is noted. The gallbladder is unremarkable. Pancreas: Unremarkable. No pancreatic ductal dilatation or surrounding inflammatory changes. Spleen: Normal in size without focal abnormality. Adrenals/Urinary Tract: Adrenal glands are within normal limits.  Kidneys are well visualize within normal enhancement pattern. No renal calculi are seen. A dominant cyst is again noted within the right kidney measuring  approximately 5.4 cm. This is stable from the prior exam. Smaller lateral right renal cyst is noted as well stable from the prior study. Bladder is partially distended. Stomach/Bowel: The appendix is not visualized and may have been surgically removed. No inflammatory changes are seen. Scattered diverticular change of the colon is noted. The stomach and small bowel show no acute abnormality. Vascular/Lymphatic: Aortic atherosclerosis. No enlarged abdominal or pelvic lymph nodes. Reproductive: Status post hysterectomy. No adnexal masses. Other: No abdominal wall hernia or abnormality. No abdominopelvic ascites. Musculoskeletal: Postsurgical changes in the proximal left femur are seen. Degenerative changes of the lumbar spine are noted. IMPRESSION: Chronic changes stable from the prior exam. No acute abnormality noted. Electronically Signed   By: Inez Catalina M.D.   On: 07/01/2019 20:37   Dg Chest Portable 1 View  Result Date: 07/01/2019 CLINICAL DATA:  Abdominal pain and weakness EXAM: PORTABLE CHEST 1 VIEW COMPARISON:  10/07/2018 FINDINGS: Cardiac shadow is within normal limits. Aortic calcifications are seen. Mild vascular congestion is noted with interstitial edema consistent with CHF. No focal infiltrate or sizable effusion is noted. No acute bony abnormality is seen. Postsurgical changes in the right axilla are again noted. IMPRESSION: Changes of mild CHF new from the prior exam. Electronically Signed   By: Inez Catalina M.D.   On: 07/01/2019 21:10   ASSESSMENT AND PLAN:   Pulmonary edema- no diagnosis of heart failure -ECHO pending -Will give a dose of IV Lasix 20 mg x 1 today -Continue home metoprolol -PT eval pending  Hypokalemia -Replete and recheck  Hypertension- BP stable -Continue home metoprolol  Type 2 diabetes -Continue lantus and SSI  Parkinson's disease- not on any meds at home -Supportive care -PT  GERD / dysphagia- stable -EGD 07/2018 with no abnormalities -Continue PPI  and carafate -SLP consult  Severe protein calorie malnutrition -Dietician consult  Attempted to update son, Herbie Baltimore, via the phone but had to leave a voicemail.  All the records are reviewed and case discussed with Care Management/Social Worker. Management plans discussed with the patient, family and they are in agreement.  CODE STATUS: Full Code  TOTAL TIME TAKING CARE OF THIS PATIENT: 40 minutes.   More than 50% of the time was spent in counseling/coordination of care: YES  POSSIBLE D/C IN 1-2 DAYS, DEPENDING ON CLINICAL CONDITION.   Berna Spare Gail Creekmore M.D on 07/02/2019 at 1:49 PM  Between 7am to 6pm - Pager - [phone]  After 6pm go to [email or web address] - Proofreader  [facility]  Office  [phone]  CC: Primary care physician; Remi Haggard, FNP  Note: This dictation was prepared with Dragon dictation along with smaller phrase technology. Any transcriptional errors that result from this process are unintentional.

## 2019-07-02 NOTE — Progress Notes (Signed)
Initial Nutrition Assessment  DOCUMENTATION CODES:   Severe malnutrition in context of chronic illness  INTERVENTION:   Glucerna Shake po TID, each supplement provides 220 kcal and 10 grams of protein  Magic cup TID with meals, each supplement provides 290 kcal and 9 grams of protein  MVI daily   Vitamin C 280m po BID   Dysphagia 3 diet  Assist with meals  NUTRITION DIAGNOSIS:   Severe Malnutrition related to chronic illness(parkinson's) as evidenced by moderate fat depletion, severe muscle depletion, percent weight loss.  GOAL:   Patient will meet greater than or equal to 90% of their needs  MONITOR:   PO intake, Supplement acceptance, Labs, Weight trends, Skin, I & O's  REASON FOR ASSESSMENT:   Malnutrition Screening Tool    ASSESSMENT:   85year old female with h/o Parkinson's Disease, HTN, T2DM, anxiety, depression, breast cancer w/ Rt breast lumpectomy, diverticulitis, endometrial cancer, total abdominal hysterectomy w/ bilateral salpingostomatomy, GERD and peptic ulcer admitted with failure to thrive and pulmonary edema   Met with pt and pt's son in room today. Pt reports poor appetite and oral intake at baseline. Pt reports that some of her poor oral intake is related to her parkinson's and being unable to get food into her mouth and some is related to her dentures not fitting and having sore gums and being unable to eat tough foods. Pt reports that she needs assistance with meals. Pt also reports that she can eat better with plastic utensils. Pt does drink one Glucerna per day at home. RD will change pt to a dysphagia 3 diet and add vitamins to help pt meet her estimated needs and to support wound healing. Will also change patient over to assist with meals and have someone help her order foods from off the menu that she will eat. Will add Glucerna since patient already enjoys this and drinks this at home. Spoke with patient about ways to increase her calorie and  protein intake at home; also recommended drinking 2-4 Glucerna per day.   Per chart, pt has lost 18lbs(15%) over the past 8 months; this is significant weight loss. Pt's son also endorses weight loss.   Medications reviewed and include: aspirin, lovenox, insulin, KCl   Labs reviewed: K 2.9(L) Hgb 10.7(L), Hct 33.8(L) cbgs- 88, 286 x 24 hrs AIC 6.2(H)  NUTRITION - FOCUSED PHYSICAL EXAM:    Most Recent Value  Orbital Region  Moderate depletion  Upper Arm Region  Moderate depletion  Thoracic and Lumbar Region  Severe depletion  Buccal Region  Moderate depletion  Temple Region  Severe depletion  Clavicle Bone Region  Severe depletion  Clavicle and Acromion Bone Region  Severe depletion  Scapular Bone Region  Severe depletion  Dorsal Hand  Severe depletion  Patellar Region  Severe depletion  Anterior Thigh Region  Severe depletion  Posterior Calf Region  Severe depletion  Edema (RD Assessment)  None  Hair  Reviewed  Eyes  Reviewed  Mouth  Reviewed  Skin  Reviewed  Nails  Reviewed     Diet Order:   Diet Order            DIET DYS 3 Room service appropriate? Yes with Assist; Fluid consistency: Thin  Diet effective now             EDUCATION NEEDS:   Education needs have been addressed  Skin:  Skin Assessment: Reviewed RN Assessment(Stage II sacrum)  Last BM:  10/26  Height:   Ht  Readings from Last 1 Encounters:  07/01/19 5' (1.524 m)    Weight:   Wt Readings from Last 1 Encounters:  07/02/19 46 kg    Ideal Body Weight:  45.4 kg  BMI:  Body mass index is 19.82 kg/m.  Estimated Nutritional Needs:   Kcal:  1200-1400kcal/day  Protein:  65-75g/day  Fluid:  >1.2L/day  Koleen Distance MS, RD, LDN Pager #- [phone] Office#- [phone] After Hours Pager: [phone]

## 2019-07-02 NOTE — Plan of Care (Signed)
Patient has been very weak with little to no appetite.

## 2019-07-02 NOTE — Progress Notes (Signed)
Hypoglycemic Event  CBG: 59  Treatment: 4 oz of juice    Symptoms: patient diaphoretic   Follow-up CBG: Time:1920 CBG Result:79  Possible Reasons for Event: patient was not thru with dinner  Comments/MD notified:none   Feliberto Gottron

## 2019-07-02 NOTE — Progress Notes (Signed)
*  PRELIMINARY RESULTS* Echocardiogram 2D Echocardiogram has been performed.  Angela Savage 07/02/2019, 11:22 AM

## 2019-07-02 NOTE — Plan of Care (Signed)
  Problem: Coping: Goal: Level of anxiety will decrease Outcome: Progressing   Problem: Pain Managment: Goal: General experience of comfort will improve Outcome: Progressing   Problem: Safety: Goal: Ability to remain free from injury will improve Outcome: Progressing   

## 2019-07-03 LAB — CBC
HCT: 40.7 % (ref 36.0–46.0)
Hemoglobin: 13 g/dL (ref 12.0–15.0)
MCH: 28 pg (ref 26.0–34.0)
MCHC: 31.9 g/dL (ref 30.0–36.0)
MCV: 87.7 fL (ref 80.0–100.0)
Platelets: 357 10*3/uL (ref 150–400)
RBC: 4.64 MIL/uL (ref 3.87–5.11)
RDW: 13.9 % (ref 11.5–15.5)
WBC: 8.3 10*3/uL (ref 4.0–10.5)
nRBC: 0 % (ref 0.0–0.2)

## 2019-07-03 LAB — BASIC METABOLIC PANEL
Anion gap: 11 (ref 5–15)
BUN: 13 mg/dL (ref 8–23)
CO2: 30 mmol/L (ref 22–32)
Calcium: 9.4 mg/dL (ref 8.9–10.3)
Chloride: 99 mmol/L (ref 98–111)
Creatinine, Ser: 0.6 mg/dL (ref 0.44–1.00)
GFR calc Af Amer: >60 mL/min (ref >60)
GFR calc non Af Amer: >60 mL/min (ref >60)
Glucose, Bld: 156 mg/dL — ABNORMAL HIGH (ref 70–99)
Potassium: 3.9 mmol/L (ref 3.5–5.1)
Sodium: 140 mmol/L (ref 135–145)

## 2019-07-03 LAB — GLUCOSE, CAPILLARY
Glucose-Capillary: 174 mg/dL — ABNORMAL HIGH (ref 70–99)
Glucose-Capillary: 251 mg/dL — ABNORMAL HIGH (ref 70–99)
Glucose-Capillary: 184 mg/dL — ABNORMAL HIGH (ref 70–99)
Glucose-Capillary: 230 mg/dL — ABNORMAL HIGH (ref 70–99)

## 2019-07-03 MED ORDER — ALUM HYDROXIDE-MAG CARBONATE 160-105 MG PO CHEW: 1.0000 | CHEWABLE_TABLET | Freq: Four times a day (QID) | ORAL | Status: DC | PRN

## 2019-07-03 MED ORDER — FUROSEMIDE 20 MG PO TABS
20.0000 mg | ORAL_TABLET | Freq: Every day | ORAL | Status: DC
  Administered 2019-07-03 – 2019-07-05 (×3): 20 mg via ORAL
  Filled 2019-07-03 (×3): qty 1

## 2019-07-03 NOTE — Progress Notes (Signed)
Inpatient Diabetes Program Recommendations  AACE/ADA: New Consensus Statement on Inpatient Glycemic Control (2015)  Target Ranges:  Prepandial:   less than 140 mg/dL      Peak postprandial:   less than 180 mg/dL (1-2 hours)      Critically ill patients:  140 - 180 mg/dL   Lab Results  Component Value Date   GLUCAP 251 (H) 07/03/2019   HGBA1C 6.2 (H) 07/02/2019    Review of Glycemic Control Results for JANEMARIE, GILLENWATER (MRN [id number]) as of 07/03/2019 12:26  Ref. Range 07/02/2019 07:54 07/02/2019 11:44 07/02/2019 17:04 07/02/2019 18:57 07/02/2019 19:20 07/02/2019 20:58 07/03/2019 08:14 07/03/2019 12:15  Glucose-Capillary Latest Ref Range: 70 - 99 mg/dL 88 286 (H) 162 (H) 59 (L) 79 112 (H) 174 (H) 251 (H)   Diabetes history: DM2 Outpatient Diabetes medications: Glimepiride 4 mg Daily, Lantus 8 units Daily at noon, Metformin 1000 mg bid Current orders for Inpatient glycemic control:  Novolog 0-9 units tid + hs  Glucerna ordered tid between meals  Inpatient Diabetes Program Recommendations:    Glucose currently over 200. Noted Novolog 0-9 units tid + hs coverage ordered.  Note pt on basal insulin at home. Watch trends for now but if glucose trends remain elevated may need basal insulin tonight or tomorrow.  Thanks,  Tama Headings RN, MSN, BC-ADM Inpatient Diabetes Coordinator Team Pager [phone] (8a-5p)

## 2019-07-03 NOTE — Plan of Care (Signed)
  Problem: Clinical Measurements: Goal: Will remain free from infection Outcome: Progressing Goal: Cardiovascular complication will be avoided Outcome: Progressing   Problem: Nutrition: Goal: Adequate nutrition will be maintained Outcome: Not Progressing   Problem: Coping: Goal: Level of anxiety will decrease Outcome: Progressing

## 2019-07-03 NOTE — Progress Notes (Signed)
[facility] [place] NAME: Angela Savage    MR#:  [id number]  DATE OF BIRTH:  06/04/1932  SUBJECTIVE:   Patient states she is feeling a little bit better today.  She endorses continued generalized weakness.  She denies any focal weakness, numbness, or chills.  She states that her tremors little bit better today.  She denies any chest pain or shortness of breath.  REVIEW OF SYSTEMS:  Review of Systems  Constitutional: Positive for malaise/fatigue. Negative for chills and fever.  HENT: Negative for congestion and sore throat.   Eyes: Negative for blurred vision and double vision.  Respiratory: Negative for cough and shortness of breath.   Cardiovascular: Negative for chest pain and palpitations.  Gastrointestinal: Negative for nausea and vomiting.  Genitourinary: Negative for dysuria and urgency.  Musculoskeletal: Negative for back pain and neck pain.  Neurological: Positive for tremors and weakness. Negative for dizziness, focal weakness and headaches.  Psychiatric/Behavioral: Negative for depression. The patient is not nervous/anxious.     DRUG ALLERGIES:   Allergies  Allergen Reactions  . Avandia [Rosiglitazone Maleate] Other (See Comments)    Rapid heart rate  . Carbidopa-Levodopa   . Ciprofloxacin     Heart problems   . Codeine   . Glucovance [Glyburide-Metformin] Other (See Comments)    Rapid heart beat  . Rosiglitazone Other (See Comments) and Palpitations    Chest pain.   VITALS:  Blood pressure (!) 154/94, pulse 94, temperature 98.4 F (36.9 C), temperature source Oral, resp. rate 18, height 5' (1.524 m), weight 45.5 kg, SpO2 95 %. PHYSICAL EXAMINATION:  Physical Exam  GENERAL:  Laying in the bed with no acute distress. Thin and chronically ill-appearing. HEENT: Head atraumatic, normocephalic. Pupils equal, round, reactive to light and accommodation. No scleral icterus. Extraocular muscles intact. Oropharynx and  nasopharynx clear.  NECK:  Supple, no jugular venous distention. No thyroid enlargement. LUNGS: Lungs are clear to auscultation bilaterally. No wheezes, crackles, rhonchi. No use of accessory muscles of respiration.  CARDIOVASCULAR: RRR, S1, S2 normal. No murmurs, rubs, or gallops.  ABDOMEN: Soft, nontender, nondistended. Bowel sounds present.  EXTREMITIES: No pedal edema, cyanosis, or clubbing.  NEUROLOGIC: CN 2-12 intact, no focal deficits. 5/5 muscle strength throughout all extremities. Sensation intact throughout. Gait not checked. +upper extremity tremors PSYCHIATRIC: The patient is alert and oriented x 3.  SKIN: No obvious rash, lesion, or ulcer.  LABORATORY PANEL:  Female CBC Recent Labs  Lab 07/03/19 0510  WBC 8.3  HGB 13.0  HCT 40.7  PLT 357   ------------------------------------------------------------------------------------------------------------------ Chemistries  Recent Labs  Lab 07/01/19 1617  07/03/19 0510  NA 140   < > 140  K 3.3*   < > 3.9  CL 98   < > 99  CO2 29   < > 30  GLUCOSE 108*   < > 156*  BUN 15   < > 13  CREATININE 0.62   < > 0.60  CALCIUM 9.5   < > 9.4  AST 24  --   --   ALT 15  --   --   ALKPHOS 42  --   --   BILITOT 0.5  --   --    < > = values in this interval not displayed.   RADIOLOGY:  No results found. ASSESSMENT AND PLAN:   Acute on chronic diastolic congestive heart failure-improving -ECHO with EF 65-70% and grade 2 diastolic dysfunction -Given a dose of IV Lasix  yesterday, will start Lasix 20 mg p.o. daily -Continue home metoprolol -PT eval pending  Hypertension- BP stable -Continue home metoprolol  Type 2 diabetes -Continue lantus and SSI  Parkinson's disease- not on any meds at home -Supportive care -PT eval pending  GERD / dysphagia- stable -EGD 07/2018 with no abnormalities -Continue PPI  -SLP recommending dysphagia 3 diet  Severe protein calorie malnutrition -Dietician consult  Son, Herbie [place], updated via the  phone.  All the records are reviewed and case discussed with Care Management/Social Worker. Management plans discussed with the patient, family and they are in agreement.  CODE STATUS: Full Code  TOTAL TIME TAKING CARE OF THIS PATIENT: 38 minutes.   More than 50% of the time was spent in counseling/coordination of care: YES  POSSIBLE D/C IN 1-2 DAYS, DEPENDING ON CLINICAL CONDITION.   Berna Spare  M.D on 07/03/2019 at 3:16 PM  Between 7am to 6pm - Pager - [phone]  After 6pm go to [email or web address] - Proofreader  [facility]  Office  [phone]  CC: Primary care physician; Remi Haggard, FNP  Note: This dictation was prepared with Dragon dictation along with smaller phrase technology. Any transcriptional errors that result from this process are unintentional.

## 2019-07-04 DIAGNOSIS — Z515 Encounter for palliative care: Secondary | ICD-10-CM

## 2019-07-04 DIAGNOSIS — Z7189 Other specified counseling: Secondary | ICD-10-CM

## 2019-07-04 LAB — BASIC METABOLIC PANEL
Anion gap: 14 (ref 5–15)
BUN: 20 mg/dL (ref 8–23)
CO2: 27 mmol/L (ref 22–32)
Calcium: 9.7 mg/dL (ref 8.9–10.3)
Chloride: 97 mmol/L — ABNORMAL LOW (ref 98–111)
Creatinine, Ser: 0.77 mg/dL (ref 0.44–1.00)
GFR calc Af Amer: >60 mL/min (ref >60)
GFR calc non Af Amer: >60 mL/min (ref >60)
Glucose, Bld: 278 mg/dL — ABNORMAL HIGH (ref 70–99)
Potassium: 4.1 mmol/L (ref 3.5–5.1)
Sodium: 138 mmol/L (ref 135–145)

## 2019-07-04 LAB — GLUCOSE, CAPILLARY
Glucose-Capillary: 255 mg/dL — ABNORMAL HIGH (ref 70–99)
Glucose-Capillary: 207 mg/dL — ABNORMAL HIGH (ref 70–99)
Glucose-Capillary: 191 mg/dL — ABNORMAL HIGH (ref 70–99)
Glucose-Capillary: 190 mg/dL — ABNORMAL HIGH (ref 70–99)
Glucose-Capillary: 155 mg/dL — ABNORMAL HIGH (ref 70–99)

## 2019-07-04 MED ORDER — METOPROLOL TARTRATE 50 MG PO TABS
50.0000 mg | ORAL_TABLET | Freq: Two times a day (BID) | ORAL | Status: DC
  Administered 2019-07-04 – 2019-07-10 (×12): 50 mg via ORAL
  Filled 2019-07-04 (×12): qty 1

## 2019-07-04 MED ORDER — METOPROLOL TARTRATE 5 MG/5ML IV SOLN
5.0000 mg | Freq: Four times a day (QID) | INTRAVENOUS | Status: DC | PRN
  Administered 2019-07-07: 5 mg via INTRAVENOUS
  Filled 2019-07-04: qty 5

## 2019-07-04 MED ORDER — INSULIN GLARGINE 100 UNIT/ML ~~LOC~~ SOLN
8.0000 [IU] | Freq: Every day | SUBCUTANEOUS | Status: DC
  Administered 2019-07-04: 8 [IU] via SUBCUTANEOUS
  Filled 2019-07-04 (×2): qty 0.08

## 2019-07-04 NOTE — Consult Note (Signed)
Consultation Note Date: 07/04/2019   Patient Name: Angela Savage  DOB: 17-Feb-1932  MRN: [id number]  Age / Sex: 85 y.o., female  PCP: Remi Haggard, FNP Referring Physician: Sela Hua, MD  Reason for Consultation: Establishing goals of care  HPI/Patient Profile: Angela Savage  is a 85 y.o. female who presents with chief complaint as above.  Patient presents the ED with a complaint of weakness and chest discomfort.  Her son states that she told him her chest was hurting this afternoon and to call 911. She was admitted for pulmonary edema.    Clinical Assessment and Goals of Care: Patient is sitting in bedside chair with son at bedside. Patient is very confused which son states is new. She was upset stating "someone said I'm a drug addict. They said I use heroine and crack. I don't use that stuff." Son states there are 5 children. One does not come to see her or call her. The second, the patient "runs off anytime she comes to the house". The youngest is busy and is unable to help. 2 of the children help their mother.   He states she lives alone. Prior to 2 weeks ago, she could get up from her lift chair and walk to the kitchen and bathroom with a rolator. The family would help with house work, and bring her food. Over the last 2 weeks, she has needed assistance with ambulation and ADL's, and family has been present at all times. Her intake sounds to be adequate.   We discussed her diagnosis, prognosis, GOC, EOL wishes disposition and options. The difference between an aggressive medical intervention path and a comfort care path was discussed.  Values and goals of care important to patient and family were attempted to be elicited.  Discussed limitations of medical interventions to prolong quality of life in some situations and discussed the concept of human mortality.  Son states his sister had  called hospice for her in January, but "she ran them off and wouldn't let them in." He states she signed papers to revoke hospice. He advises she will not allow home health into the home.  He states at the time, she wanted CPR but no ventilator. He will speak with the family about this further.   The family's goal is SNF placement. Recommend palliative at this time.          SUMMARY OF RECOMMENDATIONS   Would recommend palliative at D/C.   Would recommend initiating Risperdal or Zyprexa for confusion and agitation.   Prognosis:   Unable to determine  Discharge Planning: To Be Determined      Primary Diagnoses: Present on Admission: . Parkinson's disease ([place]) . HTN (hypertension) . Pulmonary edema . GERD (gastroesophageal reflux disease)   I have reviewed the medical record, interviewed the patient and family, and examined the patient. The following aspects are pertinent.  Past Medical History:  Diagnosis Date  . Allergy   . Anemia   . Anxiety and depression   . Breast cancer ([place])   .  Cataracts, bilateral   . Diabetes mellitus   . Diverticulitis   . Endometrial cancer ([place])   . GERD (gastroesophageal reflux disease)   . Hyperlipidemia   . Hypertension   . Parkinson's disease ([place])   . Peptic ulcer   . Tremor   . Umbilical hernia    Social History   Socioeconomic History  . Marital status: Widowed    Spouse name: Not on file  . Number of children: 6  . Years of education: Not on file  . Highest education level: Not on file  Occupational History  . Occupation: retired  Scientific laboratory technician  . Financial resource strain: Not on file  . Food insecurity    Worry: Not on file    Inability: Not on file  . Transportation needs    Medical: Not on file    Non-medical: Not on file  Tobacco Use  . Smoking status: Never Smoker  . Smokeless tobacco: Never Used  Substance and Sexual Activity  . Alcohol use: No  . Drug use: No  . Sexual activity: Not Currently   Lifestyle  . Physical activity    Days per week: Not on file    Minutes per session: Not on file  . Stress: Not on file  Relationships  . Social Herbalist on phone: Not on file    Gets together: Not on file    Attends religious service: Not on file    Active member of club or organization: Not on file    Attends meetings of clubs or organizations: Not on file    Relationship status: Not on file  Other Topics Concern  . Not on file  Social History Narrative   The patient is retired and widowed   She has 6 children she lives with 1 of them   No alcohol or tobacco or drug use   Family History  Problem Relation Age of Onset  . Colon cancer Maternal Aunt   . Breast cancer Daughter        sisters x3  . Ovarian cancer Sister   . Diabetes Father        children  . Stroke Mother   . Kidney disease Neg Hx   . Bladder Cancer Neg Hx   . Colon polyps Neg Hx    Scheduled Meds: . ALPRAZolam  0.5 mg Oral QID  . aspirin  81 mg Oral Daily  . enoxaparin (LOVENOX) injection  30 mg Subcutaneous QHS  . feeding supplement (GLUCERNA SHAKE)  237 mL Oral TID BM  . furosemide  20 mg Oral Daily  . insulin aspart  0-5 Units Subcutaneous QHS  . insulin aspart  0-9 Units Subcutaneous TID WC  . insulin glargine  8 Units Subcutaneous QHS  . metoprolol tartrate  50 mg Oral BID  . multivitamin with minerals  1 tablet Oral Daily  . sucralfate  1 g Oral QID  . vitamin C  250 mg Oral BID   Continuous Infusions: . sodium chloride Stopped (07/02/19 0957)   PRN Meds:.sodium chloride, acetaminophen **OR** acetaminophen, Alum Hydroxide-Mag Carbonate, calcium carbonate, metoprolol tartrate, ondansetron **OR** ondansetron (ZOFRAN) IV Medications Prior to Admission:  Prior to Admission medications   Medication Sig Start Date End Date Taking? Authorizing Provider  acetaminophen (TYLENOL) 325 MG tablet Take 650 mg by mouth every 4 (four) hours as needed.    Yes [provider]  ALPRAZolam  Duanne Moron) 1 MG tablet Take 1 tablet (1 mg total) by mouth  2 (two) times daily. Patient taking differently: Take 0.5 mg by mouth 4 (four) times daily.  10/10/18  Yes Salary, Avel Peace, MD  Calcium Carbonate-Simethicone (ALKA-SELTZER HEARTBURN + GAS) 750-80 MG CHEW Chew 1 tablet by mouth as needed.   Yes [provider]  fluticasone (FLONASE) 50 MCG/ACT nasal spray Place 2 sprays into both nostrils daily.  08/26/15  Yes [provider]  glucose blood test strip Accu-Chek Compact Test strips   Yes [provider]  LANTUS SOLOSTAR 100 UNIT/ML Solostar Pen Inject 8 Units into the skin daily at 12 noon.  08/11/15  Yes [provider]  metFORMIN (GLUCOPHAGE) 1000 MG tablet Take 1,000 mg by mouth 2 (two) times daily with a meal.     Yes [provider]  metoprolol succinate (TOPROL-XL) 50 MG 24 hr tablet Take 50 mg by mouth daily.  11/08/15  Yes [provider]  Alum Hydroxide-Mag Carbonate (GAVISCON PO) Take 1 tablet by mouth 3 (three) times daily before meals.    [provider]  aspirin 81 MG tablet Take 81 mg by mouth daily.    [provider]  bismuth subsalicylate (PEPTO BISMOL) 262 MG/15ML suspension Take by mouth as needed.    [provider]  Cholecalciferol (VITAMIN D3) 5000 units CAPS Take by mouth. Take 1 capsule 3 times a week    [provider]  feeding supplement, ENSURE ENLIVE, (ENSURE ENLIVE) LIQD Take 237 mLs by mouth 2 (two) times daily between meals. Patient not taking: Reported on 07/01/2019 10/10/18   Salary, Holly Bodily D, MD  glimepiride (AMARYL) 4 MG tablet Take 4 mg by mouth daily with breakfast.    [provider]  hydrochlorothiazide 25 MG tablet Take 25 mg by mouth every morning.      [provider]  metFORMIN (GLUCOPHAGE-XR) 500 MG 24 hr tablet Take 500 mg by mouth daily. 06/26/19   [provider]  Multiple Vitamin (MULTIVITAMIN WITH MINERALS) TABS tablet Take 1 tablet by  mouth daily. Patient not taking: Reported on 07/01/2019 10/11/18   Salary, Holly Bodily D, MD  potassium chloride SA (K-DUR,KLOR-CON) 20 MEQ tablet Take 20 mEq by mouth daily.     [provider]  pramipexole (MIRAPEX) 1.5 MG tablet Take 1.5 mg by mouth daily.     [provider]  Pumpkin Seed-Soy Germ (AZO BLADDER CONTROL/GO-LESS PO) Take 1 tablet by mouth 2 (two) times a week.     [provider]  sucralfate (CARAFATE) 1 g tablet Take 1 g by mouth 4 (four) times daily. 06/26/19   [provider]   Allergies  Allergen Reactions  . Avandia [Rosiglitazone Maleate] Other (See Comments)    Rapid heart rate  . Carbidopa-Levodopa   . Ciprofloxacin     Heart problems   . Codeine   . Glucovance [Glyburide-Metformin] Other (See Comments)    Rapid heart beat  . Rosiglitazone Other (See Comments) and Palpitations    Chest pain.   Review of Systems  Unable to perform ROS   Physical Exam Pulmonary:     Effort: Pulmonary effort is normal.  Neurological:     Mental Status: She is alert.     Vital Signs: BP (!) 146/79 (BP Location: Left Arm)   Pulse (!) 110   Temp 99.7 F (37.6 C) (Oral)   Resp 20   Ht 5' (1.524 m)   Wt 43.7 kg   SpO2 95%   BMI 18.83 kg/m  Pain Scale: 0-10   Pain Score:  Asleep   SpO2: SpO2: 95 % O2 Device:SpO2: 95 % O2 Flow Rate: .   IO: Intake/output summary:   Intake/Output Summary (Last 24 hours) at 07/04/2019 1540 Last data filed at 07/04/2019 1011 Gross per 24 hour  Intake -  Output 500 ml  Net -500 ml    LBM: Last BM Date: 07/04/19 Baseline Weight: Weight: 44.5 kg Most recent weight: Weight: 43.7 kg     Palliative Assessment/Data:     Time In: 3:00 Time Out: 3:50 Time Total: 50 min Greater than 50%  of this time was spent counseling and coordinating care related to the above assessment and plan.  Signed by: Asencion Gowda, NP   Please contact Palliative Medicine Team phone at [phone] for questions and  concerns.  For individual provider: See Shea Evans

## 2019-07-04 NOTE — Progress Notes (Signed)
Inpatient Diabetes Program Recommendations  AACE/ADA: New Consensus Statement on Inpatient Glycemic Control (2015)  Target Ranges:  Prepandial:   less than 140 mg/dL      Peak postprandial:   less than 180 mg/dL (1-2 hours)      Critically ill patients:  140 - 180 mg/dL   Lab Results  Component Value Date   GLUCAP 255 (H) 07/04/2019   HGBA1C 6.2 (H) 07/02/2019    Review of Glycemic Control Results for Angela Savage, Angela Savage (MRN [id number]) as of 07/04/2019 11:13  Ref. Range 07/03/2019 08:14 07/03/2019 12:15 07/03/2019 17:05 07/03/2019 22:21 07/04/2019 07:42  Glucose-Capillary Latest Ref Range: 70 - 99 mg/dL 174 (H) 251 (H) 184 (H) 230 (H) 255 (H)   Diabetes history: DM2 Outpatient Diabetes medications: Glimepiride 4 mg Daily, Lantus 8 units Daily at noon, Metformin 1000 mg bid Current orders for Inpatient glycemic control:  Novolog 0-9 units tid + hs  Glucerna ordered tid between meals  Inpatient Diabetes Program Recommendations:    Note pt on basal insulin at home. Glucose trends in low 200's this am. Consider a portion of basal insulin, Lantus 5 units.  Thanks,  Tama Headings RN, MSN, BC-ADM Inpatient Diabetes Coordinator Team Pager [phone] (8a-5p)

## 2019-07-04 NOTE — Progress Notes (Signed)
[name]    MR#:  [id number]  DATE OF BIRTH:  03/03/1932  SUBJECTIVE:   Patient states she is doing fine this morning.  She denies any chest pain or shortness of breath.  She states that her tremor is about at baseline.  REVIEW OF SYSTEMS:  Review of Systems  Constitutional: Positive for malaise/fatigue. Negative for chills and fever.  HENT: Negative for congestion and sore throat.   Eyes: Negative for blurred vision and double vision.  Respiratory: Negative for cough and shortness of breath.   Cardiovascular: Negative for chest pain and palpitations.  Gastrointestinal: Negative for nausea and vomiting.  Genitourinary: Negative for dysuria and urgency.  Musculoskeletal: Negative for back pain and neck pain.  Neurological: Positive for tremors and weakness. Negative for dizziness, focal weakness and headaches.  Psychiatric/Behavioral: Negative for depression. The patient is not nervous/anxious.     DRUG ALLERGIES:   Allergies  Allergen Reactions  . Avandia [Rosiglitazone Maleate] Other (See Comments)    Rapid heart rate  . Carbidopa-Levodopa   . Ciprofloxacin     Heart problems   . Codeine   . Glucovance [Glyburide-Metformin] Other (See Comments)    Rapid heart beat  . Rosiglitazone Other (See Comments) and Palpitations    Chest pain.   VITALS:  Blood pressure (!) 146/79, pulse (!) 110, temperature 99.7 F (37.6 C), temperature source Oral, resp. rate 20, height 5' (1.524 m), weight 43.7 kg, SpO2 95 %. PHYSICAL EXAMINATION:  Physical Exam  GENERAL:  Laying in the bed with no acute distress. Thin and chronically ill-appearing. HEENT: Head atraumatic, normocephalic. Pupils equal, round, reactive to light and accommodation. No scleral icterus. Extraocular muscles intact. Oropharynx and nasopharynx clear.  NECK:  Supple, no jugular venous distention. No thyroid enlargement. LUNGS: Lungs are  clear to auscultation bilaterally. No wheezes, crackles, rhonchi. No use of accessory muscles of respiration.  CARDIOVASCULAR: RRR, S1, S2 normal. No murmurs, rubs, or gallops.  ABDOMEN: Soft, nontender, nondistended. Bowel sounds present.  EXTREMITIES: No pedal edema, cyanosis, or clubbing.  NEUROLOGIC: CN 2-12 intact, no focal deficits. 5/5 muscle strength throughout all extremities. Sensation intact throughout. Gait not checked. +bilateral upper extremity tremors PSYCHIATRIC: The patient is alert and oriented x 3.  SKIN: No obvious rash, lesion, or ulcer.  LABORATORY PANEL:  Female CBC Recent Labs  Lab 07/03/19 0510  WBC 8.3  HGB 13.0  HCT 40.7  PLT 357   ------------------------------------------------------------------------------------------------------------------ Chemistries  Recent Labs  Lab 07/01/19 1617  07/04/19 0648  NA 140   < > 138  K 3.3*   < > 4.1  CL 98   < > 97*  CO2 29   < > 27  GLUCOSE 108*   < > 278*  BUN 15   < > 20  CREATININE 0.62   < > 0.77  CALCIUM 9.5   < > 9.7  AST 24  --   --   ALT 15  --   --   ALKPHOS 42  --   --   BILITOT 0.5  --   --    < > = values in this interval not displayed.   RADIOLOGY:  No results found. ASSESSMENT AND PLAN:   Acute on chronic diastolic congestive heart failure-improving. UOP 900cc. -ECHO with EF 65-70% and grade 2 diastolic dysfunction -Continue Lasix 20 mg p.o. daily -Continue home metoprolol -PT recommending SNF  Paroxysmal atrial flutter with RVR- HR  elevated to 110. Patient has a history of this. -Increase metoprolol to 50mg  bid -Add IV lopressor 5mg  prn -Not a candidate for anticoagulation due to age and fall risk  Hypertension- BP stable -Increase home metoprolol dose  Type 2 diabetes -Continue lantus and SSI  Parkinson's disease with chronic tremors- not on any meds at home -Supportive care -PT recommending SNF  GERD / dysphagia- stable -EGD 07/2018 with no abnormalities -Continue PPI   -SLP recommending dysphagia 3 diet  Severe protein calorie malnutrition -Dietician consult  Son, Angela Savage, updated via the phone.  All the records are reviewed and case discussed with Care Management/Social Worker. Management plans discussed with the patient, family and they are in agreement.  CODE STATUS: Full Code  TOTAL TIME TAKING CARE OF THIS PATIENT: 40 minutes.   More than 50% of the time was spent in counseling/coordination of care: YES  POSSIBLE D/C IN 1-2 DAYS, DEPENDING ON CLINICAL CONDITION.   Angela Savage M.D on 07/04/2019 at 12:56 PM  Between 7am to 6pm - Pager [phone]  After 6pm go to [email or web address] - Proofreader  [facility]  Office  [phone]  CC: Primary care physician; Angela Haggard, FNP  Note: This dictation was prepared with Dragon dictation along with smaller phrase technology. Any transcriptional errors that result from this process are unintentional.

## 2019-07-04 NOTE — Evaluation (Signed)
Physical Therapy Evaluation Patient Details Name: Angela Savage MRN: [id number] DOB: August 21, 1932 Today's Date: 07/04/2019   History of Present Illness  Angela Savage  is a 85 y.o. female who presented to the hospital ED 07/01/2019 via EMS from home with a complaint of weakness and chest discomfort. Patient was admitted with pulmonary edema, severe protein calorie malnutrition. Relevant PMI includes parkinson's disease, diverticulitis, breast cancer, pressure wound (sacrum), dysphagia, DMII, HTN, GERD, recent significant weight loss. Chest x-ray shows changes of mild CHF new from prior exam. Abdominal CT show chronic changes stable frim the prior exam.    Clinical Impression  Patient was pleasantly confused and unable to provide reliable history. Spoke to son, Herbie Baltimore, to obtain detailed history. Prior to hospitalization, patient lived at home alone and was able to ambulate short household distances mod I with rollator to bathroom and kitchen. She was able to perform pericare with seat elevated toilet. Her son brought meals by twice a day and another family member assisted with sponge bath once a week. She also had help with housework. She spent most of her time in her lift chair. Two weeks ago, her function declined significantly and she was unable to get out of lift chair, eat, perform pericare, or ambulate without some assistance from family. Upon physical therapy evaluation, patient required min A for supine to sit and mod A for scooting to edge of bed. She was unable to maintain balance at edge of bed with intermittent loss of trunk control to the R. She was able to transfer sit <> stand using RW and min A but required intermittent min A to maintain standing balance due to backward lean and unsteadiness. Patient able to ambulate ~ 7 feet with RW but HR climbed to 126 bpm that returned to baseline upon resting. Patient was limited by significant parkinsonian tremor and difficulty initiating  movement. Patient appears to have experienced a significant decline in functional strength and independence and would benefit from short term rehab prior to returning home. Patient would benefit from physical therapy to address impairments and functional limitations (see PT Problem List below) to work towards stated goals and return to PLOF or maximal functional independence.      Follow Up Recommendations SNF    Equipment Recommendations  Rolling walker with 5" wheels;3in1 (PT)    Recommendations for Other Services OT consult     Precautions / Restrictions Precautions Precautions: Fall Restrictions Weight Bearing Restrictions: No      Mobility  Bed Mobility Overal bed mobility: Needs Assistance Bed Mobility: Supine to Sit     Supine to sit: Min assist;HOB elevated;Mod assist     General bed mobility comments: very slow, restricted by tremor, required intermittant trunk support and mod a to scoot to edge of bed.  Transfers Overall transfer level: Needs assistance Equipment used: Standard walker Transfers: Sit to/from Stand Sit to Stand: Min assist         General transfer comment: Patient requires extra time to shift forward, initiate movement, and min A to prevent backwards fall and orient COG over BOS. Practiced sit <> stand x 5 from/to chair.  Ambulation/Gait Ambulation/Gait assistance: Min assist Gait Distance (Feet): 7 Feet Assistive device: Rolling walker (2 wheeled) Gait Pattern/deviations: Decreased stride length;Shuffle;Leaning posteriorly;Trunk flexed Gait velocity: extremely slow.   General Gait Details: Patient required min A to keep balance and shift forward to initiate gate. Had difficulty with AD management. HR climbed to 126 bpm, but returned to baseline with rest. Tremor and difficulty  with movement inhibiting ablity.  Stairs            Wheelchair Mobility    Modified Rankin (Stroke Patients Only)       Balance Overall balance  assessment: Needs assistance Sitting-balance support: Bilateral upper extremity supported Sitting balance-Leahy Scale: Poor Sitting balance - Comments: Patient with intermittant loss of balance (towards right) in sitting. Postural control: Right lateral lean;Posterior lean Standing balance support: Bilateral upper extremity supported;During functional activity Standing balance-Leahy Scale: Poor Standing balance comment: Pateint requires min A to maintain balance and prevent intermittant backward and sideways lean.                             Pertinent Vitals/Pain Pain Assessment: 0-10 Pain Score: 7  Pain Location: right arm. States constant ache due to cancer. baseline for her per pt. Pain Descriptors / Indicators: Aching Pain Intervention(s): Limited activity within patient's tolerance    Home Living Family/patient expects to be discharged to:: Skilled nursing facility Living Arrangements: Alone Available Help at Discharge: Family;Available PRN/intermittently Type of Home: House Home Access: Stairs to enter;Other (comment)(ramp at back of home but needs repairs) Entrance Stairs-Rails: None Entrance Stairs-Number of Steps: 6-7 Home Layout: Two level;Able to live on main level with bedroom/bathroom Home Equipment: Walker - 4 wheels;Bedside commode;Transport chair;Other (comment)(lift chair) Additional Comments: Patient not able to provide reliable history. Spoke to son, Herbie Baltimore, who provided history. States that her function declined significantly 2 weeks ago.    Prior Function Level of Independence: Needs assistance   Gait / Transfers Assistance Needed: ambulates in home with rollator to bathroom and kitchen  ADL's / Homemaking Assistance Needed: Patient required assistance with bathing, meal prep, and house cleaning. Family stopped by 2 times a day with meals and once a week to bathe (up to 2 weeks ago when she become much more limited)  Comments: Patient not able to  provide reliable history. Spoke to son, Herbie Baltimore, who provided history. States that her function declined significantly 2 weeks ago.     Hand Dominance   Dominant Hand: Right    Extremity/Trunk Assessment   Upper Extremity Assessment Upper Extremity Assessment: Generalized weakness(significant tremor due to parkinsons)    Lower Extremity Assessment Lower Extremity Assessment: Generalized weakness(significant tremor due to parkinsons)    Cervical / Trunk Assessment Cervical / Trunk Assessment: Kyphotic(poor trunk control, very kyphotic)  Communication   Communication: HOH  Cognition Arousal/Alertness: Awake/alert   Overall Cognitive Status: No family/caregiver present to determine baseline cognitive functioning                                 General Comments: Patient able to state name, confused with DOB, disoriented to situation but able to provide some accurate information.      General Comments      Exercises Other Exercises Other Exercises: practiced sit <> stand x 7 from chair (improved balance and motor initiation) with CGA - min A.   Assessment/Plan    PT Assessment Patient needs continued PT services  PT Problem List Decreased strength;Decreased mobility;Decreased safety awareness;Decreased range of motion;Decreased coordination;Decreased knowledge of precautions;Decreased activity tolerance;Decreased cognition;Cardiopulmonary status limiting activity;Decreased balance;Decreased knowledge of use of DME;Pain;Decreased skin integrity       PT Treatment Interventions DME instruction;Therapeutic activities;Cognitive remediation;Gait training;Therapeutic exercise;Patient/family education;Balance training;Functional mobility training;Neuromuscular re-education    PT Goals (Current goals can be found in the Care Plan  section)  Acute Rehab PT Goals Patient Stated Goal: return home PT Goal Formulation: With patient Time For Goal Achievement:  10/10/19 Potential to Achieve Goals: Fair    Frequency Min 2X/week   Barriers to discharge Decreased caregiver support;Inaccessible home environment Patient currently requires 24/7 care    Co-evaluation               AM-PAC PT "6 Clicks" Mobility  Outcome Measure Help needed turning from your back to your side while in a flat bed without using bedrails?: A Lot Help needed moving from lying on your back to sitting on the side of a flat bed without using bedrails?: A Little Help needed moving to and from a bed to a chair (including a wheelchair)?: A Little Help needed standing up from a chair using your arms (e.g., wheelchair or bedside chair)?: A Little Help needed to walk in hospital room?: A Little Help needed climbing 3-5 steps with a railing? : Total 6 Click Score: 15    End of Session Equipment Utilized During Treatment: Gait belt Activity Tolerance: Patient tolerated treatment well;Patient limited by fatigue;Treatment limited secondary to medical complications (Comment)(elevated HR to 126 during ambulation.) Patient left: with call bell/phone within reach;in chair;with chair alarm set Nurse Communication: Mobility status(patient requests drink) PT Visit Diagnosis: Unsteadiness on feet (R26.81);Other abnormalities of gait and mobility (R26.89);Muscle weakness (generalized) (M62.81)    Time: 1020-1100 PT Time Calculation (min) (ACUTE ONLY): 40 min   Charges:   PT Evaluation $PT Eval Moderate Complexity: 1 Mod PT Treatments $Gait Training: 8-22 mins $Therapeutic Activity: 8-22 mins       Everlean Alstrom. Graylon Good, PT, DPT 07/04/19, 11:35 AM

## 2019-07-04 NOTE — Care Management Important Message (Signed)
Important Message  Patient Details  Name: Angela Savage MRN: [id number] Date of Birth: 01-31-1932   Medicare Important Message Given:  Yes     [name] 07/04/2019, 2:18 PM

## 2019-07-04 NOTE — Plan of Care (Signed)
  Problem: Clinical Measurements: Goal: Diagnostic test results will improve Outcome: Progressing   Problem: Nutrition: Goal: Adequate nutrition will be maintained Outcome: Not Progressing   Problem: Elimination: Goal: Will not experience complications related to urinary retention Outcome: Progressing   Problem: Safety: Goal: Ability to remain free from injury will improve Outcome: Progressing

## 2019-07-05 ENCOUNTER — Inpatient Hospital Stay: Payer: Medicare Other

## 2019-07-05 LAB — GLUCOSE, CAPILLARY
Glucose-Capillary: 281 mg/dL — ABNORMAL HIGH (ref 70–99)
Glucose-Capillary: 143 mg/dL — ABNORMAL HIGH (ref 70–99)
Glucose-Capillary: 59 mg/dL — ABNORMAL LOW (ref 70–99)
Glucose-Capillary: 197 mg/dL — ABNORMAL HIGH (ref 70–99)
Glucose-Capillary: 257 mg/dL — ABNORMAL HIGH (ref 70–99)

## 2019-07-05 LAB — BASIC METABOLIC PANEL
Anion gap: 11 (ref 5–15)
BUN: 21 mg/dL (ref 8–23)
CO2: 31 mmol/L (ref 22–32)
Calcium: 9.3 mg/dL (ref 8.9–10.3)
Chloride: 98 mmol/L (ref 98–111)
Creatinine, Ser: 0.61 mg/dL (ref 0.44–1.00)
GFR calc Af Amer: >60 mL/min (ref >60)
GFR calc non Af Amer: >60 mL/min (ref >60)
Glucose, Bld: 230 mg/dL — ABNORMAL HIGH (ref 70–99)
Potassium: 3.5 mmol/L (ref 3.5–5.1)
Sodium: 140 mmol/L (ref 135–145)

## 2019-07-05 MED ORDER — OLANZAPINE 5 MG PO TABS
5.0000 mg | ORAL_TABLET | Freq: Every day | ORAL | Status: DC
  Administered 2019-07-05 – 2019-07-09 (×5): 5 mg via ORAL
  Filled 2019-07-05 (×6): qty 1

## 2019-07-05 NOTE — Progress Notes (Signed)
Per CCMD patient just had 14 beats of vtach. Patient asymptomatic, Dr. Brett Albino notified. Will continue to monitor.

## 2019-07-05 NOTE — TOC Initial Note (Signed)
Transition of Care [facility]) - Initial/Assessment Note    Patient Details  Name: Angela Savage MRN: [id number] Date of Birth: 1932/02/11  Transition of Care [facility]) CM/SW Contact:    Latanya Maudlin, RN Phone Number: 07/05/2019, 8:59 AM  Clinical Narrative:  TOC consulted to assist with disposition. PT is recommending SNF. Son is agreeable. Patient is currently confused and son says this is not baseline for the patient. Patient typically lives alone and uses a rollator. Adult children are able to come in and assist with meals and household needs. Patient was active with hospice recently but per the son "ran them off" so he signed form to release her from those services. FL2 done, offers sent out through the [place]. Will inform family of bed offers once available.              Expected Discharge Plan: Skilled Nursing Facility Barriers to Discharge: Continued Medical Work up   Patient Goals and CMS Choice   CMS Medicare.gov Compare Post Acute Care list provided to:: Patient Choice offered to / list presented to : Patient, Adult Children  Expected Discharge Plan and Services Expected Discharge Plan: [place] Acute Care Choice: [place]                                        Prior Living Arrangements/Services   Lives with:: Self Patient language and need for interpreter reviewed:: Yes Do you feel safe going back to the place where you live?: Yes      Need for Family Participation in Patient Care: Yes (Comment)(pt w/ AMS)   Current home services: DME Criminal Activity/Legal Involvement Pertinent to Current Situation/Hospitalization: No - Comment as needed  Activities of Daily Living Home Assistive Devices/Equipment: Environmental consultant (specify type), Shower chair with back ADL Screening (condition at time of admission) Patient's cognitive ability adequate to safely complete daily activities?: Yes Is the patient deaf or have difficulty hearing?:  Yes Does the patient have difficulty seeing, even when wearing glasses/contacts?: No Does the patient have difficulty concentrating, remembering, or making decisions?: No Patient able to express need for assistance with ADLs?: Yes Does the patient have difficulty dressing or bathing?: Yes Independently performs ADLs?: No Communication: Independent Dressing (OT): Needs assistance Is this a change from baseline?: Pre-admission baseline Grooming: Needs assistance Is this a change from baseline?: Pre-admission baseline Feeding: Independent Bathing: Needs assistance Is this a change from baseline?: Pre-admission baseline Toileting: Needs assistance Is this a change from baseline?: Pre-admission baseline In/Out Bed: Needs assistance Is this a change from baseline?: Pre-admission baseline Walks in Home: Needs assistance Is this a change from baseline?: Pre-admission baseline Does the patient have difficulty walking or climbing stairs?: Yes Weakness of Legs: Both Weakness of Arms/Hands: Both  Permission Sought/Granted Permission sought to share information with : Case Manager                Emotional Assessment Appearance:: Appears stated age Attitude/Demeanor/Rapport: Unable to Assess Affect (typically observed): Unable to Assess Orientation: : Oriented to Self, Oriented to Place      Admission diagnosis:  Weakness [R53.1] Chest pain, unspecified type [R07.9] Left lower quadrant abdominal pain [R10.32] Patient Active Problem List   Diagnosis Date Noted  . Pressure injury of skin 07/02/2019  . Protein-calorie malnutrition, severe 07/02/2019  . Pulmonary edema 07/01/2019  . GERD (gastroesophageal reflux disease) 07/01/2019  .  Acute CHF (congestive heart failure) ([place]) 07/01/2019  . Parkinson's disease ([place]) 10/10/2018  . Weakness 10/07/2018  . Closed intertrochanteric fracture ([place]) 01/15/2018  . Disorder of bursae of shoulder region 01/15/2018  . Osteoarthritis of knee  01/15/2018  . History of breast cancer in female 12/10/2017  . HTN (hypertension) 09/21/2017  . Elevated hemoglobin A1c 09/21/2017  . Parkinsonian tremor ([place]) 10/05/2016  . Hx of fracture of left hip 12/06/2015  . Type 2 diabetes mellitus without complication, without long-term current use of insulin ([place]) 12/06/2015  . Chronic UTI 10/27/2015  . Gross hematuria 10/27/2015  . Atrophic vaginitis 10/27/2015  . Dysfunctional gallbladder 07/06/2015  . CN (constipation) 01/06/2015  . H/O disease 01/06/2015  . Abdominal pain, left lower quadrant 01/06/2015  . Abdominal pain, right upper quadrant 01/06/2015  . H/O acute pancreatitis 01/06/2015   PCP:  Remi Haggard, FNP Pharmacy:   [facility], [address] [place] [address] Phone: [phone] Fax: [phone]     Social Determinants of Health (SDOH) Interventions    Readmission Risk Interventions Readmission Risk Prevention Plan 07/05/2019  Post Dischage Appt Complete  Medication Screening Complete  Transportation Screening Complete  Some recent data might be hidden
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
## 2019-07-05 NOTE — NC FL2 (Signed)
[place] LEVEL OF CARE SCREENING TOOL     IDENTIFICATION  Patient Name: Angela Savage Birthdate: 08-16-1932 Sex: female Admission Date (Current Location): 07/01/2019  [place] and [place] Number:  Engineering geologist and Address:  [facility], [address] [place], [place] [address]      Provider Number: [id number]  Attending Physician Name and Address:  Sela Hua, MD  Relative Name and Phone Number:  [place], Pent [phone]  [phone]    Current Level of Care: Hospital Recommended Level of Care: [place] Prior Approval Number:    Date Approved/Denied:   PASRR Number: [id number]  Discharge Plan: SNF    Current Diagnoses: Patient Active Problem List   Diagnosis Date Noted  . Pressure injury of skin 07/02/2019  . Protein-calorie malnutrition, severe 07/02/2019  . Pulmonary edema 07/01/2019  . GERD (gastroesophageal reflux disease) 07/01/2019  . Acute CHF (congestive heart failure) ([place]) 07/01/2019  . Parkinson's disease ([place]) 10/10/2018  . Weakness 10/07/2018  . Closed intertrochanteric fracture ([place]) 01/15/2018  . Disorder of bursae of shoulder region 01/15/2018  . Osteoarthritis of knee 01/15/2018  . History of breast cancer in female 12/10/2017  . HTN (hypertension) 09/21/2017  . Elevated hemoglobin A1c 09/21/2017  . Parkinsonian tremor ([place]) 10/05/2016  . Hx of fracture of left hip 12/06/2015  . Type 2 diabetes mellitus without complication, without long-term current use of insulin ([place]) 12/06/2015  . Chronic UTI 10/27/2015  . Gross hematuria 10/27/2015  . Atrophic vaginitis 10/27/2015  . Dysfunctional gallbladder 07/06/2015  . CN (constipation) 01/06/2015  . H/O disease 01/06/2015  . Abdominal pain, left lower quadrant 01/06/2015  . Abdominal pain, right upper quadrant 01/06/2015  . H/O acute pancreatitis 01/06/2015    Orientation RESPIRATION BLADDER Height & Weight      Self, Place  Normal Incontinent Weight: 43.1 kg Height:  5' (152.4 cm)  BEHAVIORAL SYMPTOMS/MOOD NEUROLOGICAL BOWEL NUTRITION STATUS      Continent    AMBULATORY STATUS COMMUNICATION OF NEEDS Skin   Extensive Assist Verbally Skin abrasions, Bruising                       Personal Care Assistance Level of Assistance  Bathing, Feeding, Dressing Bathing Assistance: Maximum assistance Feeding assistance: Limited assistance Dressing Assistance: Maximum assistance     Functional Limitations Info  Sight, Speech, Hearing Sight Info: Adequate Hearing Info: Adequate Speech Info: Adequate    SPECIAL CARE FACTORS FREQUENCY  PT (By licensed PT), OT (By licensed OT)     PT Frequency: 5x per week OT Frequency: 5x per week            Contractures Contractures Info: Not present    Additional Factors Info  Code Status, Allergies Code Status Info: partial code Allergies Info: avandia, ciprofloxcin, carbidopa-levidopa, codeine, glucovance, rosiglitazone           Current Medications (07/05/2019):  This is the current hospital active medication list Current Facility-Administered Medications  Medication Dose Route Frequency Provider Last Rate Last Dose  . 0.9 %  sodium chloride infusion   Intravenous PRN Mayo, [place] Pelt, MD   Stopped at 07/02/19 0957  . acetaminophen (TYLENOL) tablet 650 mg  650 mg Oral Q6H PRN Lance Coon, MD   650 mg at 07/03/19 2249   Or  . acetaminophen (TYLENOL) suppository 650 mg  650 mg Rectal Q6H PRN Lance Coon, MD      . ALPRAZolam Duanne Moron) tablet 0.5 mg  0.5  mg Oral QID Lance Coon, MD   0.5 mg at 07/05/19 [id number]  . Alum Hydroxide-Mag Carbonate 160-105 MG CHEW 1 tablet  1 tablet Oral QID PRN Mayo, Pete Pelt, MD      . aspirin chewable tablet 81 mg  81 mg Oral Daily Lance Coon, MD   81 mg at 07/05/19 [id number]  . calcium carbonate (TUMS - dosed in mg elemental calcium) chewable tablet 200-400 mg of elemental calcium  1-2 tablet Oral TID PRN Mayo,  Pete Pelt, MD   200 mg of elemental calcium at 07/02/19 1429  . enoxaparin (LOVENOX) injection 30 mg  30 mg Subcutaneous QHS Lance Coon, MD   30 mg at 07/04/19 2310  . feeding supplement (GLUCERNA SHAKE) (GLUCERNA SHAKE) liquid 237 mL  237 mL Oral TID BM Mayo, Pete Pelt, MD   237 mL at 07/04/19 1504  . furosemide (LASIX) tablet 20 mg  20 mg Oral Daily Mayo, Pete Pelt, MD   20 mg at 07/05/19 [id number]  . insulin aspart (novoLOG) injection 0-5 Units  0-5 Units Subcutaneous QHS Lance Coon, MD   2 Units at 07/03/19 2249  . insulin aspart (novoLOG) injection 0-9 Units  0-9 Units Subcutaneous TID WC Lance Coon, MD   5 Units at 07/05/19 [phone]  . insulin glargine (LANTUS) injection 8 Units  8 Units Subcutaneous QHS Mayo, Pete Pelt, MD   8 Units at 07/04/19 2310  . metoprolol tartrate (LOPRESSOR) injection 5 mg  5 mg Intravenous Q6H PRN Mayo, Pete Pelt, MD      . metoprolol tartrate (LOPRESSOR) tablet 50 mg  50 mg Oral BID Sela Hua, MD   50 mg at 07/05/19 [id number]  . multivitamin with minerals tablet 1 tablet  1 tablet Oral Daily Mayo, Pete Pelt, MD   1 tablet at 07/05/19 [phone]  . ondansetron (ZOFRAN) tablet 4 mg  4 mg Oral Q6H PRN Lance Coon, MD       Or  . ondansetron [facility]) injection 4 mg  4 mg Intravenous Q6H PRN Lance Coon, MD   4 mg at 07/03/19 1444  . sucralfate (CARAFATE) tablet 1 g  1 g Oral QID Mayo, Pete Pelt, MD   1 g at 07/05/19 [phone]  . vitamin C (ASCORBIC ACID) tablet 250 mg  250 mg Oral BID Sela Hua, MD   250 mg at 07/05/19 [id number]     Discharge Medications: Please see discharge summary for a list of discharge medications.  Relevant Imaging Results:  Relevant Lab Results:   Additional Information ss- [id number]  Latanya Maudlin, RN

## 2019-07-05 NOTE — Progress Notes (Signed)
Hypoglycemic Event  CBG: 59  Treatment: 4 oz orange juice   Symptoms: asymptomatic   Follow-up CBG: Time: 1229 CBG Result:143  Possible Reasons for Event: patient has poor appetite   Comments/MD notified: no new orders     Angela Savage

## 2019-07-06 DIAGNOSIS — I10 Essential (primary) hypertension: Secondary | ICD-10-CM

## 2019-07-06 DIAGNOSIS — J810 Acute pulmonary edema: Secondary | ICD-10-CM

## 2019-07-06 DIAGNOSIS — E119 Type 2 diabetes mellitus without complications: Secondary | ICD-10-CM

## 2019-07-06 DIAGNOSIS — I509 Heart failure, unspecified: Secondary | ICD-10-CM

## 2019-07-06 DIAGNOSIS — G20 Parkinson's disease: Secondary | ICD-10-CM

## 2019-07-06 DIAGNOSIS — E43 Unspecified severe protein-calorie malnutrition: Secondary | ICD-10-CM

## 2019-07-06 LAB — BASIC METABOLIC PANEL
Anion gap: 14 (ref 5–15)
BUN: 20 mg/dL (ref 8–23)
CO2: 28 mmol/L (ref 22–32)
Calcium: 9.1 mg/dL (ref 8.9–10.3)
Chloride: 96 mmol/L — ABNORMAL LOW (ref 98–111)
Creatinine, Ser: 0.59 mg/dL (ref 0.44–1.00)
GFR calc Af Amer: >60 mL/min (ref >60)
GFR calc non Af Amer: >60 mL/min (ref >60)
Glucose, Bld: 138 mg/dL — ABNORMAL HIGH (ref 70–99)
Potassium: 3.4 mmol/L — ABNORMAL LOW (ref 3.5–5.1)
Sodium: 138 mmol/L (ref 135–145)

## 2019-07-06 LAB — GLUCOSE, CAPILLARY
Glucose-Capillary: 165 mg/dL — ABNORMAL HIGH (ref 70–99)
Glucose-Capillary: 205 mg/dL — ABNORMAL HIGH (ref 70–99)
Glucose-Capillary: 272 mg/dL — ABNORMAL HIGH (ref 70–99)
Glucose-Capillary: 149 mg/dL — ABNORMAL HIGH (ref 70–99)

## 2019-07-06 MED ORDER — FUROSEMIDE 20 MG PO TABS
20.0000 mg | ORAL_TABLET | Freq: Every day | ORAL | Status: DC
  Administered 2019-07-06 – 2019-07-10 (×5): 20 mg via ORAL
  Filled 2019-07-06 (×5): qty 1

## 2019-07-06 MED ORDER — FUROSEMIDE 20 MG PO TABS: 10.0000 mg | ORAL_TABLET | Freq: Every day | ORAL | Status: DC

## 2019-07-06 NOTE — Plan of Care (Signed)
  Problem: Education: Goal: Knowledge of General Education information will improve Description Including pain rating scale, medication(s)/side effects and non-pharmacologic comfort measures Outcome: Progressing   Problem: Clinical Measurements: Goal: Cardiovascular complication will be avoided Outcome: Progressing   Problem: Safety: Goal: Ability to remain free from injury will improve Outcome: Progressing   

## 2019-07-06 NOTE — Progress Notes (Signed)
PROGRESS NOTE    Angela Savage  [id number] DOB: 1931-12-30 DOA: 07/01/2019 PCP: Remi Haggard, FNP   Brief Narrative:  Per admitting physician: Angela Savage  is a 85 y.o. female who presents with chief complaint as above. Patient presents the ED with a complaint of weakness and chest discomfort.  Her son states that she told him her chest was hurting this afternoon and to call 911.  Here in the ED tonight she elaborates quite a bit on prior GI complaints and test that she has had.  She states that her chest bothers her when she feels like she needs to burp but cannot.  Work-up otherwise is largely within normal limits, except for some mild pulmonary edema on x-ray which is new for her.  Hospitalist were called for admission for work-up for the same.  While in the hospital patient being treated for acute on chronic diastolic congestive heart failure exacerbation, a flutter/fib with RVR and metabolic encephalopathy   Assessment & Plan:   Principal Problem:   Pulmonary edema Active Problems:   HTN (hypertension)   Type 2 diabetes mellitus without complication, without long-term current use of insulin (HCC)   Parkinson's disease (HCC)   GERD (gastroesophageal reflux disease)   Acute CHF (congestive heart failure) (HCC)   Pressure injury of skin   Protein-calorie malnutrition, severe   Acute on chronic diastolic congestive heart failure- improving.  -Net 2.4L out. -ECHO with EF 65-70% and grade 2 diastolic dysfunction -Repeat CXR 10/31 was clear- prior attending held additional doses of lasix  -will add lo dose po lasix -Continue home metoprolol -PT recommending SNF -Seen by palliative care, who recommended outpatient palliative on discharge  Paroxysmal atrial flutter with RVR- resolved, patient is in NSR this morning. -Continue metoprolol at increased dose of 50mg  bid-control improved -c/w IV lopressor 5mg  prn -Not a candidate for anticoagulation due to age and  fall risk  Delirium/sundowning- patient is confused this morning.  -Neg CT head -c/w zyprexa, started perpalliative recommendations  Hypertension- BP stable -Continue metoprolol as above  Type 2 diabetes -Discontinue lantus due to episode of hypoglycemia today -Glucose variable, 138 this morning -Continue SSI  Parkinson's disease with chronic tremors- not on any meds at home -Supportive care -PT recommending SNF -DO NOT GIVE HER PARKINSON MEDS, SHE GET VIOLENTLY ILL  GERD / dysphagia- stable -EGD 07/2018 with no abnormalities -Continue PPI  -SLP recommending dysphagia 3 diet  Severe protein calorie malnutrition -Glucerna tid  Plan is for discharge to SNF when patient is medically stable.   DVT prophylaxis: Lovenox SQ  Code Status: FullFull    Code Status Orders  (From admission, onward)         Start     Ordered   07/01/19 2345  Full code  Continuous     07/01/19 2344        Code Status History    Date Active Date Inactive Code Status Order ID Comments User Context   10/08/2018 0054 10/10/2018 2127 Full Code [id number]  Harvie Bridge, DO ED   Advance Care Planning Activity    Advance Directive Documentation     Most Recent Value  Type of Advance Directive  Healthcare Power of Attorney, Living will  Pre-existing out of facility DNR order (yellow form or pink MOST form)  --  "MOST" Form in Place?  --     Family Communication: Discussed with son by phone Disposition Plan:   Patient remained inpatient, continue diuresis, adjust medications for heart rate control,  continue physical therapy for gait mobility balance strength and transfer.  Patient not yet ready for medical discharge Consults called: None Admission status: Inpatient   Consultants:   Palliative care  Procedures:  Dg Chest 1 View  Result Date: 07/05/2019 CLINICAL DATA:  Shortness of breath. EXAM: CHEST  1 VIEW COMPARISON:  07/01/2019 FINDINGS: Normal sized heart. Mildly tortuous and  calcified thoracic aorta. Clear lungs. The lungs remain hyperexpanded with stable diffuse peribronchial thickening and accentuation of the interstitial markings. Right axillary surgical clips are again demonstrated. Diffuse osteopenia. IMPRESSION: No acute abnormality. Stable changes of COPD and chronic bronchitis. Electronically Signed   By: Claudie Revering M.D.   On: 07/05/2019 10:07   Ct Head Wo Contrast  Result Date: 07/05/2019 CLINICAL DATA:  Altered level of consciousness. Increased confusion. EXAM: CT HEAD WITHOUT CONTRAST TECHNIQUE: Contiguous axial images were obtained from the base of the skull through the vertex without intravenous contrast. COMPARISON:  10/08/2018 FINDINGS: Brain: No evidence of acute infarction, hemorrhage, hydrocephalus, extra-axial collection or mass lesion/mass effect. There is ventricular sulcal enlargement consistent with mild diffuse atrophy. Patchy white matter hypoattenuation is noted bilaterally consistent with mild chronic microvascular ischemic change. These findings are stable. Vascular: No hyperdense vessel or unexpected calcification. Skull: Normal. Negative for fracture or focal lesion. Sinuses/Orbits: Globes and orbits are unremarkable. Visualized sinuses and mastoid air cells are clear. Other: None. IMPRESSION: 1. No acute intracranial abnormalities. 2. Mild atrophy and chronic microvascular ischemic change. Electronically Signed   By: Lajean Manes M.D.   On: 07/05/2019 15:33   Ct Abdomen Pelvis W Contrast  Result Date: 07/01/2019 CLINICAL DATA:  Abdominal pain and failure to thrive EXAM: CT ABDOMEN AND PELVIS WITH CONTRAST TECHNIQUE: Multidetector CT imaging of the abdomen and pelvis was performed using the standard protocol following bolus administration of intravenous contrast. CONTRAST:  45mL OMNIPAQUE IOHEXOL 300 MG/ML  SOLN COMPARISON:  10/07/2018 FINDINGS: Lower chest: No acute abnormality. Hepatobiliary: Fatty infiltration of the liver is noted. The  gallbladder is unremarkable. Pancreas: Unremarkable. No pancreatic ductal dilatation or surrounding inflammatory changes. Spleen: Normal in size without focal abnormality. Adrenals/Urinary Tract: Adrenal glands are within normal limits. Kidneys are well visualize within normal enhancement pattern. No renal calculi are seen. A dominant cyst is again noted within the right kidney measuring approximately 5.4 cm. This is stable from the prior exam. Smaller lateral right renal cyst is noted as well stable from the prior study. Bladder is partially distended. Stomach/Bowel: The appendix is not visualized and may have been surgically removed. No inflammatory changes are seen. Scattered diverticular change of the colon is noted. The stomach and small bowel show no acute abnormality. Vascular/Lymphatic: Aortic atherosclerosis. No enlarged abdominal or pelvic lymph nodes. Reproductive: Status post hysterectomy. No adnexal masses. Other: No abdominal wall hernia or abnormality. No abdominopelvic ascites. Musculoskeletal: Postsurgical changes in the proximal left femur are seen. Degenerative changes of the lumbar spine are noted. IMPRESSION: Chronic changes stable from the prior exam. No acute abnormality noted. Electronically Signed   By: Inez Catalina M.D.   On: 07/01/2019 20:37   Dg Chest Portable 1 View  Result Date: 07/01/2019 CLINICAL DATA:  Abdominal pain and weakness EXAM: PORTABLE CHEST 1 VIEW COMPARISON:  10/07/2018 FINDINGS: Cardiac shadow is within normal limits. Aortic calcifications are seen. Mild vascular congestion is noted with interstitial edema consistent with CHF. No focal infiltrate or sizable effusion is noted. No acute bony abnormality is seen. Postsurgical changes in the right axilla are again noted. IMPRESSION: Changes of  mild CHF new from the prior exam. Electronically Signed   By: Inez Catalina M.D.   On: 07/01/2019 21:10     Antimicrobials:   None   Subjective: Patient still remains  confused No acute decompensation Suspect parkinsonian dementia  Objective: Vitals:   07/05/19 1724 07/05/19 2007 07/06/19 0440 07/06/19 0805  BP: 114/73 109/76 111/84 (!) 166/93  Pulse: 82 83  (!) 105  Resp: 18 19 18 20   Temp: 97.6 F (36.4 C) 99 F (37.2 C) 98.3 F (36.8 C) 98.2 F (36.8 C)  TempSrc: Oral Oral Oral Oral  SpO2: 99% 96%  (!) 84%  Weight:   42 kg   Height:        Intake/Output Summary (Last 24 hours) at 07/06/2019 1436 Last data filed at 07/06/2019 0440 Gross per 24 hour  Intake --  Output 650 ml  Net -650 ml   Filed Weights   07/04/19 0421 07/05/19 0524 07/06/19 0440  Weight: 43.7 kg 43.1 kg 42 kg    Examination:  General exam: Thin chronically ill-appearing Respiratory system: Clear to auscultation. Respiratory effort normal. Cardiovascular system: S1 & S2 heard, RRR. No JVD, murmurs, rubs, gallops or clicks. No pedal edema. Gastrointestinal system: Abdomen is nondistended, soft and nontender. No organomegaly or masses felt. Normal bowel sounds heard. Central nervous system: Alert,  confused. No focal neurological deficits. Extremities: Moves all 4 extremities freely although globally weak no focal findings, neurovascular intact. Skin: No rashes, lesions or ulcers Psychiatry: Judgement and insight impaired. Mood & affect flat.     Data Reviewed: I have personally reviewed following labs and imaging studies  CBC: Recent Labs  Lab 07/01/19 1617 07/02/19 0314 07/03/19 0510  WBC 11.4* 8.7 8.3  HGB 12.0 10.7* 13.0  HCT 38.2 33.8* 40.7  MCV 87.4 86.9 87.7  PLT 384 317 [id number]   Basic Metabolic Panel: Recent Labs  Lab 07/02/19 0314 07/02/19 1752 07/03/19 0510 07/04/19 0648 07/05/19 0640 07/06/19 0543  NA 141  --  140 138 140 138  K 2.9* 3.6 3.9 4.1 3.5 3.4*  CL 103  --  99 97* 98 96*  CO2 29  --  30 27 31 28   GLUCOSE 141*  --  156* 278* 230* 138*  BUN 10  --  13 20 21 20   CREATININE 0.49  --  0.60 0.77 0.61 0.59  CALCIUM 8.7*  --  9.4  9.7 9.3 9.1   GFR: Estimated Creatinine Clearance: 34.7 mL/min (by C-G formula based on SCr of 0.59 mg/dL). Liver Function Tests: Recent Labs  Lab 07/01/19 1617  AST 24  ALT 15  ALKPHOS 42  BILITOT 0.5  PROT 7.3  ALBUMIN 3.6   Recent Labs  Lab 07/01/19 1617  LIPASE 27   No results for input(s): AMMONIA in the last 168 hours. Coagulation Profile: No results for input(s): INR, PROTIME in the last 168 hours. Cardiac Enzymes: No results for input(s): CKTOTAL, CKMB, CKMBINDEX, TROPONINI in the last 168 hours. BNP (last 3 results) No results for input(s): PROBNP in the last 8760 hours. HbA1C: No results for input(s): HGBA1C in the last 72 hours. CBG: Recent Labs  Lab 07/05/19 1229 07/05/19 1651 07/05/19 2106 07/06/19 0809 07/06/19 1226  GLUCAP 143* 197* 257* 165* 205*   Lipid Profile: No results for input(s): CHOL, HDL, LDLCALC, TRIG, CHOLHDL, LDLDIRECT in the last 72 hours. Thyroid Function Tests: No results for input(s): TSH, T4TOTAL, FREET4, T3FREE, THYROIDAB in the last 72 hours. Anemia Panel: No results for input(s): VITAMINB12,  FOLATE, FERRITIN, TIBC, IRON, RETICCTPCT in the last 72 hours. Sepsis Labs: No results for input(s): PROCALCITON, LATICACIDVEN in the last 168 hours.  Recent Results (from the past 240 hour(s))  SARS CORONAVIRUS 2 (TAT 6-24 HRS) Nasopharyngeal Nasopharyngeal Swab     Status: None   Collection Time: 07/01/19  8:17 PM   Specimen: Nasopharyngeal Swab  Result Value Ref Range Status   SARS Coronavirus 2 NEGATIVE NEGATIVE Final    Comment: (NOTE) SARS-CoV-2 target nucleic acids are NOT DETECTED. The SARS-CoV-2 RNA is generally detectable in upper and lower respiratory specimens during the acute phase of infection. Negative results do not preclude SARS-CoV-2 infection, do not rule out co-infections with other pathogens, and should not be used as the sole basis for treatment or other patient management decisions. Negative results must be  combined with clinical observations, patient history, and epidemiological information. The expected result is Negative. Fact Sheet for Patients: SugarRoll.be Fact Sheet for Healthcare Providers: https://www.woods-mathews.com/ This test is not yet approved or cleared by the [place] FDA and  has been authorized for detection and/or diagnosis of SARS-CoV-2 by FDA under an Emergency Use Authorization (EUA). This EUA will remain  in effect (meaning this test can be used) for the duration of the COVID-19 declaration under Section 56 4(b)(1) of the Act, 21 U.S.C. section 360bbb-3(b)(1), unless the authorization is terminated or revoked sooner. Performed at [facility], [place] [address] [place], [place] [address]          Radiology Studies: Dg Chest 1 View  Result Date: 07/05/2019 CLINICAL DATA:  Shortness of breath. EXAM: CHEST  1 VIEW COMPARISON:  07/01/2019 FINDINGS: Normal sized heart. Mildly tortuous and calcified thoracic aorta. Clear lungs. The lungs remain hyperexpanded with stable diffuse peribronchial thickening and accentuation of the interstitial markings. Right axillary surgical clips are again demonstrated. Diffuse osteopenia. IMPRESSION: No acute abnormality. Stable changes of COPD and chronic bronchitis. Electronically Signed   By: Claudie Revering M.D.   On: 07/05/2019 10:07   Ct Head Wo Contrast  Result Date: 07/05/2019 CLINICAL DATA:  Altered level of consciousness. Increased confusion. EXAM: CT HEAD WITHOUT CONTRAST TECHNIQUE: Contiguous axial images were obtained from the base of the skull through the vertex without intravenous contrast. COMPARISON:  10/08/2018 FINDINGS: Brain: No evidence of acute infarction, hemorrhage, hydrocephalus, extra-axial collection or mass lesion/mass effect. There is ventricular sulcal enlargement consistent with mild diffuse atrophy. Patchy white matter hypoattenuation is noted bilaterally  consistent with mild chronic microvascular ischemic change. These findings are stable. Vascular: No hyperdense vessel or unexpected calcification. Skull: Normal. Negative for fracture or focal lesion. Sinuses/Orbits: Globes and orbits are unremarkable. Visualized sinuses and mastoid air cells are clear. Other: None. IMPRESSION: 1. No acute intracranial abnormalities. 2. Mild atrophy and chronic microvascular ischemic change. Electronically Signed   By: Lajean Manes M.D.   On: 07/05/2019 15:33        Scheduled Meds:  ALPRAZolam  0.5 mg Oral QID   aspirin  81 mg Oral Daily   enoxaparin (LOVENOX) injection  30 mg Subcutaneous QHS   feeding supplement (GLUCERNA SHAKE)  237 mL Oral TID BM   insulin aspart  0-5 Units Subcutaneous QHS   insulin aspart  0-9 Units Subcutaneous TID WC   metoprolol tartrate  50 mg Oral BID   multivitamin with minerals  1 tablet Oral Daily   OLANZapine  5 mg Oral QHS   sucralfate  1 g Oral QID   vitamin C  250 mg Oral BID  Continuous Infusions:  sodium chloride Stopped (07/02/19 0957)     LOS: 4 days    Time spent: 53 min    Nicolette Bang, MD [facility]  If 7PM-7AM, please contact night-coverage  07/06/2019, 2:36 PM

## 2019-07-07 LAB — BASIC METABOLIC PANEL
Anion gap: 10 (ref 5–15)
BUN: 19 mg/dL (ref 8–23)
CO2: 34 mmol/L — ABNORMAL HIGH (ref 22–32)
Calcium: 9.4 mg/dL (ref 8.9–10.3)
Chloride: 94 mmol/L — ABNORMAL LOW (ref 98–111)
Creatinine, Ser: 0.75 mg/dL (ref 0.44–1.00)
GFR calc Af Amer: >60 mL/min (ref >60)
GFR calc non Af Amer: >60 mL/min (ref >60)
Glucose, Bld: 173 mg/dL — ABNORMAL HIGH (ref 70–99)
Potassium: 3.5 mmol/L (ref 3.5–5.1)
Sodium: 138 mmol/L (ref 135–145)

## 2019-07-07 LAB — GLUCOSE, CAPILLARY
Glucose-Capillary: 171 mg/dL — ABNORMAL HIGH (ref 70–99)
Glucose-Capillary: 159 mg/dL — ABNORMAL HIGH (ref 70–99)
Glucose-Capillary: 128 mg/dL — ABNORMAL HIGH (ref 70–99)
Glucose-Capillary: 221 mg/dL — ABNORMAL HIGH (ref 70–99)

## 2019-07-07 LAB — SARS CORONAVIRUS 2 (TAT 6-24 HRS): SARS Coronavirus 2: NEGATIVE

## 2019-07-07 NOTE — Care Management Important Message (Signed)
Important Message  Patient Details  Name: Angela Savage MRN: [id number] Date of Birth: 1932-08-23   Medicare Important Message Given:  Yes  Verbally reviewed with son, Nida Hoffpauir, over phone.  Aware of right and aware copy left in room for reference.   Dannette Barbara 07/07/2019, 11:44 AM

## 2019-07-07 NOTE — TOC Transition Note (Addendum)
Transition of Care [facility]) - CM/SW Discharge Note   Patient Details  Name: Angela Savage MRN: [id number] Date of Birth: 1932/06/26  [facility] CM/SW Contact:  Ross Ludwig, LCSW Phone Number: 07/07/2019, 6:11 PM   Clinical Narrative:    CSW faxed updated PT notes to SNFs awaiting for bed offers.   Final next level of care: Skilled Nursing Facility Barriers to Discharge: Continued Medical Work up   Patient Goals and CMS Choice   CMS Medicare.gov Compare Post Acute Care list provided to:: Patient Choice offered to / list presented to : Patient, Adult Children  Discharge Placement                       Discharge Plan and Services     Post Acute Care Choice: [place]                               Social Determinants of Health (SDOH) Interventions     Readmission Risk Interventions Readmission Risk Prevention Plan 07/05/2019  Post Dischage Appt Complete  Medication Screening Complete  Transportation Screening Complete  Some recent data might be hidden

## 2019-07-07 NOTE — Progress Notes (Signed)
Patient was uncooperative with po meds this pm; attempted to administer meds crushed in apple sauce and she swallowed a small amount and then became agitated and spit out meds and applesauce. Pt then became combative, attempted to pinch and punch this RN; grabbed stethoscope and pulled. Pt did not receive her po metoprolol, Vit C, ativan or Zyprexa. HR had remained high, in the 120's, as high as 140. Unclear whether afib with RVR vs ST with PAC's. After pt had calmed a bit, HR remained high in 120, appeared to be ST with PAC's. Pt then given Metop 5 mg IV per standing order for HR greater than 110.   Pt also refusing care at this time, refused peri care, or turn. Will ctm and attempt care again.

## 2019-07-07 NOTE — Plan of Care (Signed)
  Problem: Education: Goal: Knowledge of General Education information will improve Description: Including pain rating scale, medication(s)/side effects and non-pharmacologic comfort measures Outcome: Not Progressing Note: Patient is confused today, speech is garbled and mostly incomprehensible. Unsure if this is her baseline. Will continue to monitor for the remainder of the shift. Wenda Low [facility]

## 2019-07-07 NOTE — Progress Notes (Signed)
PROGRESS NOTE    Angela Savage  [id number] DOB: February 05, 1932 DOA: 07/01/2019 PCP: Remi Haggard, FNP   Brief Narrative:  Per admitting physician: [name] presents with chief complaint as above. Patient presents the ED with a complaint of weakness and chest discomfort. Her son states that she told him her chest was hurting this afternoon and to call 911. Here in the ED tonight she elaborates quite a bit on prior GI complaints and test that she has had. She states that her chest bothers her when she feels like she needs to burp but cannot. Work-up otherwise is largely within normal limits,except for some mild pulmonary edema on x-ray which is new for her. Hospitalist were called for admission for work-up for the same.  While in the hospital patient being treated for acute on chronic diastolic congestive heart failure exacerbation, a flutter/fib with RVR and metabolic encephalopathy   Assessment & Plan:   Principal Problem:   Pulmonary edema Active Problems:   HTN (hypertension)   Type 2 diabetes mellitus without complication, without long-term current use of insulin (HCC)   Parkinson's disease (HCC)   GERD (gastroesophageal reflux disease)   Acute CHF (congestive heart failure) (HCC)   Pressure injury of skin   Protein-calorie malnutrition, severe   Acute on chronic diastolic congestive heart failure-improving.  -Net 3.3 L out. -ECHO with EF 65-70% and grade 2 diastolic dysfunction -Repeat CXR 10/31 was clear- prior attending held additional doses of lasix -c/w  lo dose po lasix -Continue home metoprolol -PT recommending SNF -Seen by palliative care, who recommended outpatient palliative on discharge  Paroxysmal atrial flutter with RVR-resolved, patient is in NSR this morning. -Continuemetoprolol at increased dose of50mg  bid-control improved -c/w IV lopressor 5mg  prn -Not a candidate for anticoagulation due to age and fall  risk  Delirium/sundowning- patient is confused this morning.  -Neg CT head -c/w zyprexa, started perpalliative recommendations  Hypertension- BP stable -Continuemetoprolol as above  Type 2 diabetes -Discontinue lantus due to episode of hypoglycemia  -Glucose variable, 173 this morning-monitor closely -Continue SSI  Parkinson's disease with chronic tremors- not on any meds at home -Supportive care -PT recommending SNF -DO NOT GIVE HER PARKINSON MEDS, SHE GET VIOLENTLY ILL  GERD / dysphagia- stable -EGD 07/2018 with no abnormalities -Continue PPI  -SLP recommending dysphagia 3 diet  Severe protein calorie malnutrition -Glucerna tid   DVT prophylaxis: Lovenox SQ  Code Status: Full    Code Status Orders  (From admission, onward)         Start     Ordered   07/01/19 2345  Full code  Continuous     07/01/19 2344        Code Status History    Date Active Date Inactive Code Status Order ID Comments User Context   10/08/2018 0054 10/10/2018 2127 Full Code [id number]  Harvie Bridge, DO ED   Advance Care Planning Activity    Advance Directive Documentation     Most Recent Value  Type of Advance Directive  Healthcare Power of Attorney, Living will  Pre-existing out of facility DNR order (yellow form or pink MOST form)  -  "MOST" Form in Place?  -     Family Communication: None today Disposition Plan:   Patient remained inpatient, continue diuresis, adjust medications for heart rate control, continue physical therapy for gait mobility balance strength and transfer.  Patient not yet ready for medical discharge Consults called: None Admission status: Inpatient   Consultants:   palliative care  Procedures:  Dg Chest 1 View  Result Date: 07/05/2019 CLINICAL DATA:  Shortness of breath. EXAM: CHEST  1 VIEW COMPARISON:  07/01/2019 FINDINGS: Normal sized heart. Mildly tortuous and calcified thoracic aorta. Clear lungs. The lungs remain hyperexpanded with stable  diffuse peribronchial thickening and accentuation of the interstitial markings. Right axillary surgical clips are again demonstrated. Diffuse osteopenia. IMPRESSION: No acute abnormality. Stable changes of COPD and chronic bronchitis. Electronically Signed   By: Claudie Revering M.D.   On: 07/05/2019 10:07   Ct Head Wo Contrast  Result Date: 07/05/2019 CLINICAL DATA:  Altered level of consciousness. Increased confusion. EXAM: CT HEAD WITHOUT CONTRAST TECHNIQUE: Contiguous axial images were obtained from the base of the skull through the vertex without intravenous contrast. COMPARISON:  10/08/2018 FINDINGS: Brain: No evidence of acute infarction, hemorrhage, hydrocephalus, extra-axial collection or mass lesion/mass effect. There is ventricular sulcal enlargement consistent with mild diffuse atrophy. Patchy white matter hypoattenuation is noted bilaterally consistent with mild chronic microvascular ischemic change. These findings are stable. Vascular: No hyperdense vessel or unexpected calcification. Skull: Normal. Negative for fracture or focal lesion. Sinuses/Orbits: Globes and orbits are unremarkable. Visualized sinuses and mastoid air cells are clear. Other: None. IMPRESSION: 1. No acute intracranial abnormalities. 2. Mild atrophy and chronic microvascular ischemic change. Electronically Signed   By: Lajean Manes M.D.   On: 07/05/2019 15:33   Ct Abdomen Pelvis W Contrast  Result Date: 07/01/2019 CLINICAL DATA:  Abdominal pain and failure to thrive EXAM: CT ABDOMEN AND PELVIS WITH CONTRAST TECHNIQUE: Multidetector CT imaging of the abdomen and pelvis was performed using the standard protocol following bolus administration of intravenous contrast. CONTRAST:  36mL OMNIPAQUE IOHEXOL 300 MG/ML  SOLN COMPARISON:  10/07/2018 FINDINGS: Lower chest: No acute abnormality. Hepatobiliary: Fatty infiltration of the liver is noted. The gallbladder is unremarkable. Pancreas: Unremarkable. No pancreatic ductal dilatation  or surrounding inflammatory changes. Spleen: Normal in size without focal abnormality. Adrenals/Urinary Tract: Adrenal glands are within normal limits. Kidneys are well visualize within normal enhancement pattern. No renal calculi are seen. A dominant cyst is again noted within the right kidney measuring approximately 5.4 cm. This is stable from the prior exam. Smaller lateral right renal cyst is noted as well stable from the prior study. Bladder is partially distended. Stomach/Bowel: The appendix is not visualized and may have been surgically removed. No inflammatory changes are seen. Scattered diverticular change of the colon is noted. The stomach and small bowel show no acute abnormality. Vascular/Lymphatic: Aortic atherosclerosis. No enlarged abdominal or pelvic lymph nodes. Reproductive: Status post hysterectomy. No adnexal masses. Other: No abdominal wall hernia or abnormality. No abdominopelvic ascites. Musculoskeletal: Postsurgical changes in the proximal left femur are seen. Degenerative changes of the lumbar spine are noted. IMPRESSION: Chronic changes stable from the prior exam. No acute abnormality noted. Electronically Signed   By: Inez Catalina M.D.   On: 07/01/2019 20:37   Dg Chest Portable 1 View  Result Date: 07/01/2019 CLINICAL DATA:  Abdominal pain and weakness EXAM: PORTABLE CHEST 1 VIEW COMPARISON:  10/07/2018 FINDINGS: Cardiac shadow is within normal limits. Aortic calcifications are seen. Mild vascular congestion is noted with interstitial edema consistent with CHF. No focal infiltrate or sizable effusion is noted. No acute bony abnormality is seen. Postsurgical changes in the right axilla are again noted. IMPRESSION: Changes of mild CHF new from the prior exam. Electronically Signed   By: Inez Catalina M.D.   On: 07/01/2019 21:10     Antimicrobials:   none  Subjective: Patient still pleasantly confused although more conversant and aware today No evidence of acute  decompensation Underlying parkinsonian dementia  Objective: Vitals:   07/06/19 2102 07/07/19 0438 07/07/19 0808 07/07/19 1425  BP: (!) 89/56 104/82 (!) 171/99   Pulse: 91 76 (!) 112 99  Resp:   19   Temp: 99.7 F (37.6 C) 97.8 F (36.6 C) 98.2 F (36.8 C)   TempSrc: Oral Oral    SpO2: 93% 95% 96%   Weight:      Height:        Intake/Output Summary (Last 24 hours) at 07/07/2019 1500 Last data filed at 07/07/2019 0147 Gross per 24 hour  Intake -  Output 850 ml  Net -850 ml   Filed Weights   07/04/19 0421 07/05/19 0524 07/06/19 0440  Weight: 43.7 kg 43.1 kg 42 kg    Examination:  General exam: Thin chronically ill-appearing Respiratory system: Clear to auscultation. Respiratory effort normal. Cardiovascular system: S1 & S2 heard, RRR. No JVD, murmurs, rubs, gallops or clicks. No pedal edema. Gastrointestinal system: Abdomen is nondistended, soft and nontender. No organomegaly or masses felt. Normal bowel sounds heard. Central nervous system: Alert,  confused. No focal neurological deficits. Extremities: Moves all 4 extremities freely although globally weak no focal findings, neurovascular intact. Skin: No rashes, lesions or ulcers Psychiatry: Judgement and insight impaired. Mood & affect flat.      Data Reviewed: I have personally reviewed following labs and imaging studies  CBC: Recent Labs  Lab 07/01/19 1617 07/02/19 0314 07/03/19 0510  WBC 11.4* 8.7 8.3  HGB 12.0 10.7* 13.0  HCT 38.2 33.8* 40.7  MCV 87.4 86.9 87.7  PLT 384 317 [id number]   Basic Metabolic Panel: Recent Labs  Lab 07/03/19 0510 07/04/19 0648 07/05/19 0640 07/06/19 0543 07/07/19 0522  NA 140 138 140 138 138  K 3.9 4.1 3.5 3.4* 3.5  CL 99 97* 98 96* 94*  CO2 30 27 31 28  34*  GLUCOSE 156* 278* 230* 138* 173*  BUN 13 20 21 20 19   CREATININE 0.60 0.77 0.61 0.59 0.75  CALCIUM 9.4 9.7 9.3 9.1 9.4   GFR: Estimated Creatinine Clearance: 34.7 mL/min (by C-G formula based on SCr of 0.75 mg/dL).  Liver Function Tests: Recent Labs  Lab 07/01/19 1617  AST 24  ALT 15  ALKPHOS 42  BILITOT 0.5  PROT 7.3  ALBUMIN 3.6   Recent Labs  Lab 07/01/19 1617  LIPASE 27   No results for input(s): AMMONIA in the last 168 hours. Coagulation Profile: No results for input(s): INR, PROTIME in the last 168 hours. Cardiac Enzymes: No results for input(s): CKTOTAL, CKMB, CKMBINDEX, TROPONINI in the last 168 hours. BNP (last 3 results) No results for input(s): PROBNP in the last 8760 hours. HbA1C: No results for input(s): HGBA1C in the last 72 hours. CBG: Recent Labs  Lab 07/06/19 1226 07/06/19 1643 07/06/19 2116 07/07/19 0810 07/07/19 1215  GLUCAP 205* 272* 149* 171* 159*   Lipid Profile: No results for input(s): CHOL, HDL, LDLCALC, TRIG, CHOLHDL, LDLDIRECT in the last 72 hours. Thyroid Function Tests: No results for input(s): TSH, T4TOTAL, FREET4, T3FREE, THYROIDAB in the last 72 hours. Anemia Panel: No results for input(s): VITAMINB12, FOLATE, FERRITIN, TIBC, IRON, RETICCTPCT in the last 72 hours. Sepsis Labs: No results for input(s): PROCALCITON, LATICACIDVEN in the last 168 hours.  Recent Results (from the past 240 hour(s))  SARS CORONAVIRUS 2 (TAT 6-24 HRS) Nasopharyngeal Nasopharyngeal Swab     Status: None   Collection  Time: 07/01/19  8:17 PM   Specimen: Nasopharyngeal Swab  Result Value Ref Range Status   SARS Coronavirus 2 NEGATIVE NEGATIVE Final    Comment: (NOTE) SARS-CoV-2 target nucleic acids are NOT DETECTED. The SARS-CoV-2 RNA is generally detectable in upper and lower respiratory specimens during the acute phase of infection. Negative results do not preclude SARS-CoV-2 infection, do not rule out co-infections with other pathogens, and should not be used as the sole basis for treatment or other patient management decisions. Negative results must be combined with clinical observations, patient history, and epidemiological information. The expected result is  Negative. Fact Sheet for Patients: SugarRoll.be Fact Sheet for Healthcare Providers: https://www.woods-mathews.com/ This test is not yet approved or cleared by the [place] FDA and  has been authorized for detection and/or diagnosis of SARS-CoV-2 by FDA under an Emergency Use Authorization (EUA). This EUA will remain  in effect (meaning this test can be used) for the duration of the COVID-19 declaration under Section 56 4(b)(1) of the Act, 21 U.S.C. section 360bbb-3(b)(1), unless the authorization is terminated or revoked sooner. Performed at [facility], [place] [address] [place], [place] [address]          Radiology Studies: Ct Head Wo Contrast  Result Date: 07/05/2019 CLINICAL DATA:  Altered level of consciousness. Increased confusion. EXAM: CT HEAD WITHOUT CONTRAST TECHNIQUE: Contiguous axial images were obtained from the base of the skull through the vertex without intravenous contrast. COMPARISON:  10/08/2018 FINDINGS: Brain: No evidence of acute infarction, hemorrhage, hydrocephalus, extra-axial collection or mass lesion/mass effect. There is ventricular sulcal enlargement consistent with mild diffuse atrophy. Patchy white matter hypoattenuation is noted bilaterally consistent with mild chronic microvascular ischemic change. These findings are stable. Vascular: No hyperdense vessel or unexpected calcification. Skull: Normal. Negative for fracture or focal lesion. Sinuses/Orbits: Globes and orbits are unremarkable. Visualized sinuses and mastoid air cells are clear. Other: None. IMPRESSION: 1. No acute intracranial abnormalities. 2. Mild atrophy and chronic microvascular ischemic change. Electronically Signed   By: Lajean Manes M.D.   On: 07/05/2019 15:33        Scheduled Meds: . ALPRAZolam  0.5 mg Oral QID  . aspirin  81 mg Oral Daily  . enoxaparin (LOVENOX) injection  30 mg Subcutaneous QHS  . feeding supplement (GLUCERNA  SHAKE)  237 mL Oral TID BM  . furosemide  20 mg Oral Daily  . insulin aspart  0-5 Units Subcutaneous QHS  . insulin aspart  0-9 Units Subcutaneous TID WC  . metoprolol tartrate  50 mg Oral BID  . multivitamin with minerals  1 tablet Oral Daily  . OLANZapine  5 mg Oral QHS  . sucralfate  1 g Oral QID  . vitamin C  250 mg Oral BID   Continuous Infusions: . sodium chloride Stopped (07/02/19 0957)     LOS: 5 days    Time spent: 11 min    Nicolette Bang, MD [facility]  If 7PM-7AM, please contact night-coverage  07/07/2019, 3:00 PM

## 2019-07-07 NOTE — Plan of Care (Signed)
  Problem: Education: Goal: Knowledge of General Education information will improve Description: Including pain rating scale, medication(s)/side effects and non-pharmacologic comfort measures 07/07/2019 0356 by Hector Brunswick, RN Outcome: Progressing 07/07/2019 0320 by Hector Brunswick, RN Outcome: Progressing   Problem: Safety: Goal: Ability to remain free from injury will improve 07/07/2019 0356 by Jeymi Hepp, Kandice Moos, RN Outcome: Progressing 07/07/2019 0320 by Acie Fredrickson D, RN Outcome: Progressing   Problem: Skin Integrity: Goal: Risk for impaired skin integrity will decrease 07/07/2019 0356 by Hector Brunswick, RN Outcome: Progressing 07/07/2019 0320 by Hector Brunswick, RN Outcome: Progressing

## 2019-07-08 LAB — CREATININE, SERUM
Creatinine, Ser: 0.63 mg/dL (ref 0.44–1.00)
GFR calc Af Amer: >60 mL/min (ref >60)
GFR calc non Af Amer: >60 mL/min (ref >60)

## 2019-07-08 LAB — GLUCOSE, CAPILLARY
Glucose-Capillary: 162 mg/dL — ABNORMAL HIGH (ref 70–99)
Glucose-Capillary: 150 mg/dL — ABNORMAL HIGH (ref 70–99)
Glucose-Capillary: 210 mg/dL — ABNORMAL HIGH (ref 70–99)
Glucose-Capillary: 171 mg/dL — ABNORMAL HIGH (ref 70–99)

## 2019-07-08 NOTE — TOC Progression Note (Signed)
Transition of Care [facility]) - Progression Note    Patient Details  Name: Angela Savage MRN: [id number] Date of Birth: [birth year]  Transition of [facility]) CM/SW Contact  Ross Ludwig,  Phone Number: 07/08/2019, 4:49 PM  Clinical Narrative:    Patient's son was given bed offers, and he chose [facility] [place].  CSW submitted clinical information to patient's insurance company awaiting for insurance authorization.  CSW spoke to [facility], they can accept patient once insurance has been approved.  CSW informed patient's son that once insurance approves, if patient is medically ready for discharge, then she can discharge to SNF.   Expected Discharge Plan: [place] Barriers to Discharge: Continued Medical Work up  Expected Discharge Plan and Services Expected Discharge Plan: [place] Acute Care Choice: [place]                                         Social Determinants of Health (SDOH) Interventions    Readmission Risk Interventions Readmission Risk Prevention Plan 07/05/2019  Post Dischage Appt Complete  Medication Screening Complete  Transportation Screening Complete  Some recent data might be hidden

## 2019-07-08 NOTE — Progress Notes (Signed)
Nutrition Follow Up Note   DOCUMENTATION CODES:   Severe malnutrition in context of chronic illness  INTERVENTION:   Glucerna Shake po TID, each supplement provides 220 kcal and 10 grams of protein  Magic cup TID with meals, each supplement provides 290 kcal and 9 grams of protein  MVI daily   Vitamin C 271m po BID   Dysphagia 3 diet  Assist with meals  NUTRITION DIAGNOSIS:   Severe Malnutrition related to chronic illness(parkinson's) as evidenced by moderate fat depletion, severe muscle depletion, percent weight loss.  GOAL:   Patient will meet greater than or equal to 90% of their needs  -not met   MONITOR:   PO intake, Supplement acceptance, Labs, Weight trends, Skin, I & O's  ASSESSMENT:   85year old female with h/o Parkinson's Disease, HTN, T2DM, anxiety, depression, breast cancer w/ Rt breast lumpectomy, diverticulitis, endometrial cancer, total abdominal hysterectomy w/ bilateral salpingostomatomy, GERD and peptic ulcer admitted with failure to thrive and pulmonary edema   Pt continues to have poor appetite and oral intake in hospital; pt eating <25% of meals but is drinking the Glucerna as she enjoys this and drinks it at home. Magic Cups being added to meal trays. Per chart, pt is weight stable since admit. Plan is for discharge to SNF when medically able.   Medications reviewed and include: aspirin, lovenox, lasix, insulin, MVI, Vit C, carafate   Labs reviewed: cbgs- 162, 150 x 24 hrs  Diet Order:   Diet Order            DIET DYS 3 Room service appropriate? Yes with Assist; Fluid consistency: Thin  Diet effective now             EDUCATION NEEDS:   Education needs have been addressed  Skin:  Skin Assessment: Reviewed RN Assessment(Stage II sacrum)  Last BM:  10/30- type 4  Height:   Ht Readings from Last 1 Encounters:  07/01/19 5' (1.524 m)    Weight:   Wt Readings from Last 1 Encounters:  07/08/19 44.9 kg    Ideal Body Weight:  45.4  kg  BMI:  Body mass index is 19.33 kg/m.  Estimated Nutritional Needs:   Kcal:  1200-1400kcal/day  Protein:  65-75g/day  Fluid:  >1.2L/day  CKoleen DistanceMS, RD, LDN Pager #- [phone]#- [phone] Hours Pager: [phone]

## 2019-07-08 NOTE — Progress Notes (Signed)
New referral for [facility] community Palliative to follow at [facility] received from [employer]. Discharge pending insurance authorization. Patient information given to referral. Flo Shanks BSN, RN, [place] [phone]

## 2019-07-08 NOTE — Progress Notes (Addendum)
PROGRESS NOTE    Angela Savage  [id number] DOB: [birth year] DOA: 07/01/2019 PCP: Remi Haggard, FNP   Brief Narrative:  Per admitting physician: [name] presents with chief complaint as above. Patient presents the ED with a complaint of weakness and chest discomfort. Her son states that she told him her chest was hurting this afternoon and to call 911. Here in the ED tonight she elaborates quite a bit on prior GI complaints and test that she has had. She states that her chest bothers her when she feels like she needs to burp but cannot. Work-up otherwise is largely within normal limits,except for some mild pulmonary edema on x-ray which is new for her. Hospitalist were called for admission for work-up for the same.  While in the hospital patient being treated for acute on chronic diastolic congestive heart failure exacerbation, a flutter/fib with RVR and metabolic encephalopathy.  Stable now continue to work with physical therapy for gait mobility balance and transfer.  With planned posthospitalization continuation of therapy    Assessment & Plan:   Principal Problem:   Pulmonary edema Active Problems:   HTN (hypertension)   Type 2 diabetes mellitus without complication, without long-term current use of insulin (HCC)   Parkinson's disease (HCC)   GERD (gastroesophageal reflux disease)   Acute CHF (congestive heart failure) (HCC)   Pressure injury of skin   Protein-calorie malnutrition, severe   Acute on chronic diastolic congestive heart failure-improving. -Net 3.4 L out. -ECHO with EF 65-70% and grade 2 diastolic dysfunction -Repeat CXR10/31 wasclear- prior attending held additionaldoses of lasix -c/w  lo dose po lasix -Continue home metoprolol -PT recommending SNF -Seen by palliative care, who recommended outpatient palliative on discharge  Paroxysmal atrial flutter with RVR-resolved, patient is in NSR this morning.  -Continuemetoprolol at increased dose of50mg  bid-control improved -c/wIV lopressor 5mg  prn -Not a candidate for anticoagulation due to age and fall risk  Delirium/sundowning- patient is confused this morning.  -NegCT head -c/wzyprexa, started perpalliative recommendations  Hypertension- BP stable -Continuemetoprololas above  Type 2 diabetes -Discontinue lantus due to episode of hypoglycemia  -Glucose variable, 173 this morning-monitor closely -Continue SSI  Parkinson's disease with chronic tremors- not on any meds at home -Supportive care -PT recommending SNF -DO NOT GIVE HER PARKINSON MEDS, SHE GET VIOLENTLY ILL  GERD / dysphagia- stable -EGD 07/2018 with no abnormalities -Continue PPI  -SLP recommending dysphagia 3 diet  Severe protein calorie malnutrition -Glucerna tid  DVT prophylaxis: Lovenox SQ  Code Status: Full    Code Status Orders  (From admission, onward)         Start     Ordered   07/01/19 2345  Full code  Continuous     07/01/19 2344        Code Status History    Date Active Date Inactive Code Status Order ID Comments User Context   10/08/2018 0054 10/10/2018 2127 Full Code [id number]  Harvie Bridge, DO ED   Advance Care Planning Activity    Advance Directive Documentation     Most Recent Value  Type of Advance Directive  Healthcare Power of Attorney, Living will  Pre-existing out of facility DNR order (yellow form or pink MOST form)  -  "MOST" Form in Place?  -     Family Communication: Discussed with son by phone Disposition Plan:   Patient remained inpatient, continued diuresis, adjust medications for heart rate control, continue physical therapy for gait mobility balance strength and transfer.  Consults called: None  Admission status: Inpatient   Consultants:   Palliative care  Procedures:  Dg Chest 1 View  Result Date: 07/05/2019 CLINICAL DATA:  Shortness of breath. EXAM: CHEST  1 VIEW COMPARISON:  07/01/2019  FINDINGS: Normal sized heart. Mildly tortuous and calcified thoracic aorta. Clear lungs. The lungs remain hyperexpanded with stable diffuse peribronchial thickening and accentuation of the interstitial markings. Right axillary surgical clips are again demonstrated. Diffuse osteopenia. IMPRESSION: No acute abnormality. Stable changes of COPD and chronic bronchitis. Electronically Signed   By: Claudie Revering M.D.   On: 07/05/2019 10:07   Ct Head Wo Contrast  Result Date: 07/05/2019 CLINICAL DATA:  Altered level of consciousness. Increased confusion. EXAM: CT HEAD WITHOUT CONTRAST TECHNIQUE: Contiguous axial images were obtained from the base of the skull through the vertex without intravenous contrast. COMPARISON:  10/08/2018 FINDINGS: Brain: No evidence of acute infarction, hemorrhage, hydrocephalus, extra-axial collection or mass lesion/mass effect. There is ventricular sulcal enlargement consistent with mild diffuse atrophy. Patchy white matter hypoattenuation is noted bilaterally consistent with mild chronic microvascular ischemic change. These findings are stable. Vascular: No hyperdense vessel or unexpected calcification. Skull: Normal. Negative for fracture or focal lesion. Sinuses/Orbits: Globes and orbits are unremarkable. Visualized sinuses and mastoid air cells are clear. Other: None. IMPRESSION: 1. No acute intracranial abnormalities. 2. Mild atrophy and chronic microvascular ischemic change. Electronically Signed   By: Lajean Manes M.D.   On: 07/05/2019 15:33   Ct Abdomen Pelvis W Contrast  Result Date: 07/01/2019 CLINICAL DATA:  Abdominal pain and failure to thrive EXAM: CT ABDOMEN AND PELVIS WITH CONTRAST TECHNIQUE: Multidetector CT imaging of the abdomen and pelvis was performed using the standard protocol following bolus administration of intravenous contrast. CONTRAST:  7mL OMNIPAQUE IOHEXOL 300 MG/ML  SOLN COMPARISON:  10/07/2018 FINDINGS: Lower chest: No acute abnormality. Hepatobiliary:  Fatty infiltration of the liver is noted. The gallbladder is unremarkable. Pancreas: Unremarkable. No pancreatic ductal dilatation or surrounding inflammatory changes. Spleen: Normal in size without focal abnormality. Adrenals/Urinary Tract: Adrenal glands are within normal limits. Kidneys are well visualize within normal enhancement pattern. No renal calculi are seen. A dominant cyst is again noted within the right kidney measuring approximately 5.4 cm. This is stable from the prior exam. Smaller lateral right renal cyst is noted as well stable from the prior study. Bladder is partially distended. Stomach/Bowel: The appendix is not visualized and may have been surgically removed. No inflammatory changes are seen. Scattered diverticular change of the colon is noted. The stomach and small bowel show no acute abnormality. Vascular/Lymphatic: Aortic atherosclerosis. No enlarged abdominal or pelvic lymph nodes. Reproductive: Status post hysterectomy. No adnexal masses. Other: No abdominal wall hernia or abnormality. No abdominopelvic ascites. Musculoskeletal: Postsurgical changes in the proximal left femur are seen. Degenerative changes of the lumbar spine are noted. IMPRESSION: Chronic changes stable from the prior exam. No acute abnormality noted. Electronically Signed   By: Inez Catalina M.D.   On: 07/01/2019 20:37   Dg Chest Portable 1 View  Result Date: 07/01/2019 CLINICAL DATA:  Abdominal pain and weakness EXAM: PORTABLE CHEST 1 VIEW COMPARISON:  10/07/2018 FINDINGS: Cardiac shadow is within normal limits. Aortic calcifications are seen. Mild vascular congestion is noted with interstitial edema consistent with CHF. No focal infiltrate or sizable effusion is noted. No acute bony abnormality is seen. Postsurgical changes in the right axilla are again noted. IMPRESSION: Changes of mild CHF new from the prior exam. Electronically Signed   By: Inez Catalina M.D.   On: 07/01/2019 21:10  Antimicrobials:    None   Subjective: Patient mildly combative with staff last night Appropriate with me this morning  Objective: Vitals:   07/07/19 1628 07/07/19 1950 07/08/19 0355 07/08/19 0740  BP: 129/68 123/75 (!) 141/70 115/71  Pulse: 88 (!) 107 85 97  Resp: 18 16 16 19   Temp: 98.1 F (36.7 C) 97.6 F (36.4 C) 99.2 F (37.3 C) 98.2 F (36.8 C)  TempSrc:  Oral Oral Oral  SpO2: 96% 95% 97% 96%  Weight:   44.9 kg   Height:        Intake/Output Summary (Last 24 hours) at 07/08/2019 1100 Last data filed at 07/08/2019 0003 Gross per 24 hour  Intake -  Output 150 ml  Net -150 ml   Filed Weights   07/05/19 0524 07/06/19 0440 07/08/19 0355  Weight: 43.1 kg 42 kg 44.9 kg    Examination:  General exam:Thin chronically ill-appearing Respiratory system: Clear to auscultation. Respiratory effort normal. Cardiovascular system:S1 &S2 heard, RRR. No JVD, murmurs, rubs, gallops or clicks. No pedal edema. Gastrointestinal system:Abdomen is nondistended, soft and nontender. No organomegaly or masses felt. Normal bowel sounds heard. Central nervous system:Alert, confused. No focal neurological deficits. Extremities:Moves all 4 extremities freely although globally weak no focal findings, neurovascular intact. Skin: No rashes, lesions or ulcers Psychiatry:Judgement and insight impaired. Mood & affectflat.    Data Reviewed: I have personally reviewed following labs and imaging studies  CBC: Recent Labs  Lab 07/01/19 1617 07/02/19 0314 07/03/19 0510  WBC 11.4* 8.7 8.3  HGB 12.0 10.7* 13.0  HCT 38.2 33.8* 40.7  MCV 87.4 86.9 87.7  PLT 384 317 [id number]   Basic Metabolic Panel: Recent Labs  Lab 07/03/19 0510 07/04/19 0648 07/05/19 0640 07/06/19 0543 07/07/19 0522 07/08/19 0511  NA 140 138 140 138 138  --   K 3.9 4.1 3.5 3.4* 3.5  --   CL 99 97* 98 96* 94*  --   CO2 30 27 31 28  34*  --   GLUCOSE 156* 278* 230* 138* 173*  --   BUN 13 20 21 20 19   --   CREATININE 0.60 0.77 0.61  0.59 0.75 0.63  CALCIUM 9.4 9.7 9.3 9.1 9.4  --    GFR: Estimated Creatinine Clearance: 37.1 mL/min (by C-G formula based on SCr of 0.63 mg/dL). Liver Function Tests: Recent Labs  Lab 07/01/19 1617  AST 24  ALT 15  ALKPHOS 42  BILITOT 0.5  PROT 7.3  ALBUMIN 3.6   Recent Labs  Lab 07/01/19 1617  LIPASE 27   No results for input(s): AMMONIA in the last 168 hours. Coagulation Profile: No results for input(s): INR, PROTIME in the last 168 hours. Cardiac Enzymes: No results for input(s): CKTOTAL, CKMB, CKMBINDEX, TROPONINI in the last 168 hours. BNP (last 3 results) No results for input(s): PROBNP in the last 8760 hours. HbA1C: No results for input(s): HGBA1C in the last 72 hours. CBG: Recent Labs  Lab 07/07/19 0810 07/07/19 1215 07/07/19 1656 07/07/19 2106 07/08/19 0741  GLUCAP 171* 159* 128* 221* 162*   Lipid Profile: No results for input(s): CHOL, HDL, LDLCALC, TRIG, CHOLHDL, LDLDIRECT in the last 72 hours. Thyroid Function Tests: No results for input(s): TSH, T4TOTAL, FREET4, T3FREE, THYROIDAB in the last 72 hours. Anemia Panel: No results for input(s): VITAMINB12, FOLATE, FERRITIN, TIBC, IRON, RETICCTPCT in the last 72 hours. Sepsis Labs: No results for input(s): PROCALCITON, LATICACIDVEN in the last 168 hours.  Recent Results (from the past 240 hour(s))  SARS CORONAVIRUS  2 (TAT 6-24 HRS) Nasopharyngeal Nasopharyngeal Swab     Status: None   Collection Time: 07/01/19  8:17 PM   Specimen: Nasopharyngeal Swab  Result Value Ref Range Status   SARS Coronavirus 2 NEGATIVE NEGATIVE Final    Comment: (NOTE) SARS-CoV-2 target nucleic acids are NOT DETECTED. The SARS-CoV-2 RNA is generally detectable in upper and lower respiratory specimens during the acute phase of infection. Negative results do not preclude SARS-CoV-2 infection, do not rule out co-infections with other pathogens, and should not be used as the sole basis for treatment or other patient management  decisions. Negative results must be combined with clinical observations, patient history, and epidemiological information. The expected result is Negative. Fact Sheet for Patients: SugarRoll.be Fact Sheet for Healthcare Providers: https://www.woods-mathews.com/ This test is not yet approved or cleared by the [place] FDA and  has been authorized for detection and/or diagnosis of SARS-CoV-2 by FDA under an Emergency Use Authorization (EUA). This EUA will remain  in effect (meaning this test can be used) for the duration of the COVID-19 declaration under Section 56 4(b)(1) of the Act, 21 U.S.C. section 360bbb-3(b)(1), unless the authorization is terminated or revoked sooner. Performed at [facility], [place] [address] [place], [place] [address]   SARS CORONAVIRUS 2 (TAT 6-24 HRS) Nasopharyngeal Nasopharyngeal Swab     Status: None   Collection Time: 07/07/19 10:28 AM   Specimen: Nasopharyngeal Swab  Result Value Ref Range Status   SARS Coronavirus 2 NEGATIVE NEGATIVE Final    Comment: (NOTE) SARS-CoV-2 target nucleic acids are NOT DETECTED. The SARS-CoV-2 RNA is generally detectable in upper and lower respiratory specimens during the acute phase of infection. Negative results do not preclude SARS-CoV-2 infection, do not rule out co-infections with other pathogens, and should not be used as the sole basis for treatment or other patient management decisions. Negative results must be combined with clinical observations, patient history, and epidemiological information. The expected result is Negative. Fact Sheet for Patients: SugarRoll.be Fact Sheet for Healthcare Providers: https://www.woods-mathews.com/ This test is not yet approved or cleared by the [place] FDA and  has been authorized for detection and/or diagnosis of SARS-CoV-2 by FDA under an Emergency Use Authorization (EUA). This  EUA will remain  in effect (meaning this test can be used) for the duration of the COVID-19 declaration under Section 56 4(b)(1) of the Act, 21 U.S.C. section 360bbb-3(b)(1), unless the authorization is terminated or revoked sooner. Performed at [facility], [place] [address], [place], [place] [address]          Radiology Studies: No results found.      Scheduled Meds: . ALPRAZolam  0.5 mg Oral QID  . aspirin  81 mg Oral Daily  . enoxaparin (LOVENOX) injection  30 mg Subcutaneous QHS  . feeding supplement (GLUCERNA SHAKE)  237 mL Oral TID BM  . furosemide  20 mg Oral Daily  . insulin aspart  0-5 Units Subcutaneous QHS  . insulin aspart  0-9 Units Subcutaneous TID WC  . metoprolol tartrate  50 mg Oral BID  . multivitamin with minerals  1 tablet Oral Daily  . OLANZapine  5 mg Oral QHS  . sucralfate  1 g Oral QID  . vitamin C  250 mg Oral BID   Continuous Infusions: . sodium chloride Stopped (07/02/19 0957)     LOS: 6 days    Time spent: 22 min     Nicolette Bang, MD [facility]  If 7PM-7AM, please contact  night-coverage  07/08/2019, 11:00 AM

## 2019-07-09 DIAGNOSIS — K219 Gastro-esophageal reflux disease without esophagitis: Secondary | ICD-10-CM

## 2019-07-09 LAB — BASIC METABOLIC PANEL
Anion gap: 18 — ABNORMAL HIGH (ref 5–15)
BUN: 31 mg/dL — ABNORMAL HIGH (ref 8–23)
CO2: 25 mmol/L (ref 22–32)
Calcium: 9.3 mg/dL (ref 8.9–10.3)
Chloride: 95 mmol/L — ABNORMAL LOW (ref 98–111)
Creatinine, Ser: 0.82 mg/dL (ref 0.44–1.00)
GFR calc Af Amer: >60 mL/min (ref >60)
GFR calc non Af Amer: >60 mL/min (ref >60)
Glucose, Bld: 260 mg/dL — ABNORMAL HIGH (ref 70–99)
Potassium: 3.9 mmol/L (ref 3.5–5.1)
Sodium: 138 mmol/L (ref 135–145)

## 2019-07-09 LAB — GLUCOSE, CAPILLARY
Glucose-Capillary: 257 mg/dL — ABNORMAL HIGH (ref 70–99)
Glucose-Capillary: 226 mg/dL — ABNORMAL HIGH (ref 70–99)
Glucose-Capillary: 191 mg/dL — ABNORMAL HIGH (ref 70–99)
Glucose-Capillary: 223 mg/dL — ABNORMAL HIGH (ref 70–99)

## 2019-07-09 LAB — MAGNESIUM: Magnesium: 2 mg/dL (ref 1.7–2.4)

## 2019-07-09 NOTE — Progress Notes (Signed)
Physical Therapy Treatment Patient Details Name: Angela Savage MRN: [id number] DOB: 03-Apr-1932 Today's Date: 07/09/2019    History of Present Illness Angela Savage  is a 85 y.o. female who presented to the hospital ED 07/01/2019 via EMS from home with a complaint of weakness and chest discomfort. Patient was admitted with pulmonary edema, severe protein calorie malnutrition. Relevant PMI includes parkinson's disease, diverticulitis, breast cancer, pressure wound (sacrum), dysphagia, DMII, HTN, GERD, recent significant weight loss. Chest x-ray shows changes of mild CHF new from prior exam. Abdominal CT show chronic changes stable frim the prior exam.    PT Comments    Pt laying on R side resting in bed upon PT arrival.  Pt appearing fatigued and lethargic but pt able to verbalize intermittently with therapist although difficult to hear pt d/t low voice volume.  Max assist semi-supine to/from sit and mod to max assist stand pivot transfer bed to/from recliner.  Unable to stand pt up to RW with 1 assist.  Increased time required for activities; pt requiring increased time to initiate to participate in activities.  Pt initially positioned sitting in recliner but pt requiring significant support of pillows on R side to maintain safe upright posture so therapist assisted pt back to bed to ensure pt was in a safe position once therapist left.  R lean noted in sitting and standing (also noted on initial eval but appeared increased compared to initial eval) and possible R facial droop noted although question if facial droop appearance d/t way pt leaning to R side (nurse notified).  Will continue to focus on strengthening and progressive functional mobility per pt tolerance.   Follow Up Recommendations  SNF     Equipment Recommendations  Rolling walker with 5" wheels;3in1 (PT);Wheelchair (measurements PT);Wheelchair cushion (measurements PT)    Recommendations for Other Services OT consult      Precautions / Restrictions Precautions Precautions: Fall Restrictions Weight Bearing Restrictions: No    Mobility  Bed Mobility Overal bed mobility: Needs Assistance Bed Mobility: Supine to Sit;Sit to Supine     Supine to sit: Max assist;HOB elevated Sit to supine: Max assist;HOB elevated   General bed mobility comments: assist for trunk and B LE's semi-supine to/from sit; 2 assist to boost pt up in bed end of session  Transfers Overall transfer level: Needs assistance Equipment used: None;Rolling walker (2 wheeled) Transfers: Stand Pivot Transfers;Sit to/from Stand Sit to Stand: Total assist Stand pivot transfers: Mod assist;Max assist       General transfer comment: mod to max assist x1 stand pivot bed to/from recliner; pt unable to come to full upright stand when attempting to stand from recliner up to RW with 1 assist; vc's for technique  Ambulation/Gait             General Gait Details: pt unable to stand to attempt ambulation   Stairs             Wheelchair Mobility    Modified Rankin (Stroke Patients Only)       Balance Overall balance assessment: Needs assistance Sitting-balance support: Bilateral upper extremity supported;Feet supported Sitting balance-Leahy Scale: Poor Sitting balance - Comments: pt with posterior R lean in sitting (posterior > R lean); assist levels fluctuating between close SBA to mod assist for sitting balance Postural control: Right lateral lean;Posterior lean  Cognition Arousal/Alertness: Lethargic Behavior During Therapy: Flat affect Overall Cognitive Status: No family/caregiver present to determine baseline cognitive functioning                                 General Comments: Able to state name but not DOB; disoriented to situation and place      Exercises      General Comments   Nursing cleared pt for participation in physical therapy.  Pt  agreeable to PT session.      Pertinent Vitals/Pain Pain Assessment: Faces Pain Intervention(s): Limited activity within patient's tolerance;Monitored during session;Repositioned  Vitals (HR and O2 on room air) stable and WFL throughout treatment session.    Home Living                      Prior Function            PT Goals (current goals can now be found in the care plan section) Acute Rehab PT Goals Patient Stated Goal: return home PT Goal Formulation: With patient Time For Goal Achievement: 07/18/19 Potential to Achieve Goals: Fair Progress towards PT goals: Progressing toward goals    Frequency    Min 2X/week      PT Plan Current plan remains appropriate    Co-evaluation              AM-PAC PT "6 Clicks" Mobility   Outcome Measure  Help needed turning from your back to your side while in a flat bed without using bedrails?: A Lot Help needed moving from lying on your back to sitting on the side of a flat bed without using bedrails?: A Lot Help needed moving to and from a bed to a chair (including a wheelchair)?: A Lot Help needed standing up from a chair using your arms (e.g., wheelchair or bedside chair)?: A Lot Help needed to walk in hospital room?: Total Help needed climbing 3-5 steps with a railing? : Total 6 Click Score: 10    End of Session Equipment Utilized During Treatment: Gait belt Activity Tolerance: Patient limited by fatigue;Patient limited by lethargy Patient left: in bed;with call bell/phone within reach;with bed alarm set;Other (comment)(pt laying on R side with pillow between pt's knees) Nurse Communication: Mobility status;Precautions;Other (comment)(pt requesting vanilla Ensure; Purewick needing replacement; pt's R lean and possible R facial droop (uncertain if R facial droop vs way pt leaning to R side)) PT Visit Diagnosis: Unsteadiness on feet (R26.81);Other abnormalities of gait and mobility (R26.89);Muscle weakness  (generalized) (M62.81)     Time: 1009-1040 PT Time Calculation (min) (ACUTE ONLY): 31 min  Charges:  $Therapeutic Activity: 23-37 mins                     Leitha Bleak, PT 07/09/19, 11:08 AM [phone]

## 2019-07-09 NOTE — TOC Progression Note (Addendum)
Transition of Care [facility]) - Progression Note    Patient Details  Name: Angela Savage MRN: [id number] Date of Birth: 23-Feb-1932  [facility] CM/SW Contact  Ross Ludwig, [place] Phone Number: 07/09/2019, 1:24 PM  Clinical Narrative:    Patient received phone call from [facility] regarding patient's insurance authrization for SNF.  She was requesting updated PT note, CSW faxed requested note to insurance company, CSW still waiting for insurance auth.  3:30pm  CSW received phone call from [employer], that patient has been approved for short term rehab at [facility].  Reference number is [id number], [facility] billing is [id number], insurance stated Josem Kaufmann is good through 6th of November, [place] updated physician, bedside nurse, and patient's son.  Expected Discharge Plan: [place] Barriers to Discharge: Continued Medical Work up  Expected Discharge Plan and Services Expected Discharge Plan: [place] Acute Care Choice: [place]                                         Social Determinants of Health (SDOH) Interventions    Readmission Risk Interventions Readmission Risk Prevention Plan 07/05/2019  Post Dischage Appt Complete  Medication Screening Complete  Transportation Screening Complete  Some recent data might be hidden

## 2019-07-09 NOTE — Progress Notes (Signed)
Inpatient Diabetes Program Recommendations  AACE/ADA: New Consensus Statement on Inpatient Glycemic Control  Target Ranges:  Prepandial:   less than 140 mg/dL      Peak postprandial:   less than 180 mg/dL (1-2 hours)      Critically ill patients:  140 - 180 mg/dL   Results for Angela Savage, Angela Savage (MRN [id number]) as of 07/09/2019 10:16  Ref. Range 07/08/2019 07:41 07/08/2019 12:04 07/08/2019 16:50 07/08/2019 21:26 07/09/2019 08:19  Glucose-Capillary Latest Ref Range: 70 - 99 mg/dL 162 (H) 150 (H) 210 (H) 171 (H) 257 (H)   Review of Glycemic Control  Diabetes history: DM2 Outpatient Diabetes medications: Lantus 8 units daily, Metformin 1000 mg BID, Amaryl 4 mg daily Current orders for Inpatient glycemic control: Novolog 0-9 units TID with meals, Novolog 0-5 units QHS  Inpatient Diabetes Program Recommendations:   Insulin-Basal: Fasting glucose 257 mg/dl this morning.  If glucose is consistently greater than 180 mg/dl, please consider ordering Levemir 4 units Q24H.  Thanks, Barnie Alderman, RN, MSN, CDE Diabetes Coordinator Inpatient Diabetes Program [phone] (Team Pager from 8am to 5pm)

## 2019-07-09 NOTE — Progress Notes (Signed)
[name] APP Team - Progress Note  Alexie Mangrum [id number] DOB: 01/11/1932 DOA: 07/01/2019 PCP: Remi Haggard, FNP   Brief narrative: [name] presents with chief complaint as above. Patient presents the ED with a complaint of weakness and chest discomfort. Her son states that she told him her chest was hurting this afternoon and to call 911. Here in the ED tonight she elaborates quite a bit on prior GI complaints and test that she has had. She states that her chest bothers her when she feels like she needs to burp but cannot. Work-up otherwise is largely within normal limits,except for some mild pulmonary edema on x-ray which is new for her. Hospitalist were called for admission for work-up for the same.  While in the hospital patient being treated for acute on chronic diastolic congestive heart failure exacerbation, a flutter/fib with RVR and metabolic encephalopathy.  Stable now continue to work with physical therapy for gait mobility balance and transfer.  With planned posthospitalization continuation of therapy  HPI/Subjective: Minimally conversive patient without specific complaints.  Did request Ensure.  Assessment/Plan: Acute on chronic diastolic congestive heart failure -Currently compensated and stable on room air -Net3.4L out since arrival -ECHO with EF 65-70% and grade 2 diastolic dysfunction -Repeat CXR10/31 wasclear- prior attending held additionaldoses of lasix -Remained stable on Lasix 20 mg daily -Continue home metoprolol  Physical deconditioning -PT recommended SNF-awaiting insurance authorization -PT documents max assist for repositioning from semisupine to sitting max assist to stand and pivot transfer to bed from recliner.  Patient was unable to stand for rolling walker with 1 assist.  Continues to have needs regarding strengthening and progressive functional mobility.  Patient was from home environment  prior to arrival. -Seen by palliative care, who recommended outpatient palliative on discharge   Paroxysmal atrial flutter with RVR -resolved and currently maintaining sinus rhythm -Metoprolol dose increased to 50 mg during this admission -c/wIV lopressor 5mg  prn -Not a candidate for anticoagulation due to age and fall risk  Delirium/sundowning - patient is confused on 11/3 with negative CT of the head -c/wzyprexa, started perpalliative recommendations -Urinalysis presentation unremarkable patient is afebrile and without leukocytosis  Hypertension - BP stable -Continuemetoprololas above  Type 2 diabetes -Lantus discontinued this admission due to episode of hypoglycemia  -Glucose variable,173this morning-monitor closely -Continue SSI and will likely require this method of treatment for hypoglycemia at skilled nursing facility due to variable oral intake  Parkinson's disease with chronic tremors - not on any meds at home -Supportive care -PT recommending SNF -Very intolerant therefore :DO NOT GIVE HER PARKINSON MEDS, SHE GET VIOLENTLY ILL  GERD - stable -EGD 07/2018 with no abnormalities -Continue PPI   Severe protein calorie malnutrition/dysphagia -Glucerna tid -SLP observed oral phase dysphagia impacted by increased oral motor tremor secondary to Parkinson's this is in conjunction with decreased appetite/anorexia -Recommendation is for D2/3 level diet with crushed medications in pure  DVT prophylaxis: Lovenox  Code Status:  Full  Family Communication:  Patient only  Disposition Plan/Expected LOS: Discharged to skilled nursing facility once insurance approval has been obtained  Consultants: None  Procedures: Echocardiogram  1. Left ventricular ejection fraction, by visual estimation, is 65 to 70%. The left ventricle has normal function. There is moderately increased left ventricular hypertrophy.  2. Left ventricular diastolic parameters are  consistent with Grade II diastolic dysfunction (pseudonormalization).  3. Asymetric septal bulging, likely age-related variant.  4. Global right ventricle has normal systolic function.The right ventricular size is normal. Right  vetricular wall thickness was not assessed.  5. Left atrial size was normal.  6. Right atrial size was normal.  7. The mitral valve is grossly normal. Trace mitral valve regurgitation.  8. The tricuspid valve is grossly normal. Tricuspid valve regurgitation is trivial.  9. The aortic valve is tricuspid. Aortic valve regurgitation is mild. 10. The pulmonic valve was not assessed. Pulmonic valve regurgitation not assessed. 11. Normal pulmonary artery systolic pressure. 12. The inferior vena cava is normal in size with greater than 50% respiratory variability, suggesting right atrial pressure of 3 mmHg.  Cultures: None  Antibiotics: None  Objective: Blood pressure (!) 121/53, pulse 98, temperature 98.2 F (36.8 C), temperature source Oral, resp. rate 20, height 5' (1.524 m), weight 43.8 kg, SpO2 96 %.  Intake/Output Summary (Last 24 hours) at 07/09/2019 1412 Last data filed at 07/09/2019 1100 Gross per 24 hour  Intake 0 ml  Output 0 ml  Net 0 ml     Exam: Gen: No acute respiratory distress Chest: Clear to auscultation bilaterally without wheezes, rhonchi or crackles, room air Cardiac: Regular rate and rhythm, S1-S2, no rubs murmurs or gallops, no peripheral edema, no JVD Abdomen: Soft nontender nondistended without obvious hepatosplenomegaly, no ascites Extremities: Symmetrical in appearance without cyanosis, clubbing or effusion  Scheduled Meds:  Scheduled Meds: . ALPRAZolam  0.5 mg Oral QID  . aspirin  81 mg Oral Daily  . enoxaparin (LOVENOX) injection  30 mg Subcutaneous QHS  . feeding supplement (GLUCERNA SHAKE)  237 mL Oral TID BM  . furosemide  20 mg Oral Daily  . insulin aspart  0-5 Units Subcutaneous QHS  . insulin aspart  0-9 Units  Subcutaneous TID WC  . metoprolol tartrate  50 mg Oral BID  . multivitamin with minerals  1 tablet Oral Daily  . OLANZapine  5 mg Oral QHS  . sucralfate  1 g Oral QID  . vitamin C  250 mg Oral BID   Continuous Infusions: . sodium chloride Stopped (07/02/19 0957)    Data Reviewed: Basic Metabolic Panel: Recent Labs  Lab 07/04/19 [id number] 07/05/19 0640 07/06/19 0543 07/07/19 0522 07/08/19 0511 07/09/19 0632  NA 138 140 138 138  --  138  K 4.1 3.5 3.4* 3.5  --  3.9  CL 97* 98 96* 94*  --  95*  CO2 27 31 28  34*  --  25  GLUCOSE 278* 230* 138* 173*  --  260*  BUN 20 21 20 19   --  31*  CREATININE 0.77 0.61 0.59 0.75 0.63 0.82  CALCIUM 9.7 9.3 9.1 9.4  --  9.3  MG  --   --   --   --   --  2.0   Liver Function Tests: No results for input(s): AST, ALT, ALKPHOS, BILITOT, PROT, ALBUMIN in the last 168 hours. No results for input(s): LIPASE, AMYLASE in the last 168 hours. No results for input(s): AMMONIA in the last 168 hours. CBC: Recent Labs  Lab 07/03/19 0510  WBC 8.3  HGB 13.0  HCT 40.7  MCV 87.7  PLT 357   Cardiac Enzymes: No results for input(s): CKTOTAL, CKMB, CKMBINDEX, TROPONINI in the last 168 hours. BNP (last 3 results) No results for input(s): BNP in the last 8760 hours.  ProBNP (last 3 results) No results for input(s): PROBNP in the last 8760 hours.  CBG: Recent Labs  Lab 07/08/19 1204 07/08/19 1650 07/08/19 2126 07/09/19 0819 07/09/19 1153  GLUCAP 150* 210* 171* 257* 226*    Recent  Results (from the past 240 hour(s))  SARS CORONAVIRUS 2 (TAT 6-24 HRS) Nasopharyngeal Nasopharyngeal Swab     Status: None   Collection Time: 07/01/19  8:17 PM   Specimen: Nasopharyngeal Swab  Result Value Ref Range Status   SARS Coronavirus 2 NEGATIVE NEGATIVE Final    Comment: (NOTE) SARS-CoV-2 target nucleic acids are NOT DETECTED. The SARS-CoV-2 RNA is generally detectable in upper and lower respiratory specimens during the acute phase of infection. Negative  results do not preclude SARS-CoV-2 infection, do not rule out co-infections with other pathogens, and should not be used as the sole basis for treatment or other patient management decisions. Negative results must be combined with clinical observations, patient history, and epidemiological information. The expected result is Negative. Fact Sheet for Patients: SugarRoll.be Fact Sheet for Healthcare Providers: https://www.woods-mathews.com/ This test is not yet approved or cleared by the [place] FDA and  has been authorized for detection and/or diagnosis of SARS-CoV-2 by FDA under an Emergency Use Authorization (EUA). This EUA will remain  in effect (meaning this test can be used) for the duration of the COVID-19 declaration under Section 56 4(b)(1) of the Act, 21 U.S.C. section 360bbb-3(b)(1), unless the authorization is terminated or revoked sooner. Performed at [facility], [place] [address], [place], [place] [address]   SARS CORONAVIRUS 2 (TAT 6-24 HRS) Nasopharyngeal Nasopharyngeal Swab     Status: None   Collection Time: 07/07/19 10:28 AM   Specimen: Nasopharyngeal Swab  Result Value Ref Range Status   SARS Coronavirus 2 NEGATIVE NEGATIVE Final    Comment: (NOTE) SARS-CoV-2 target nucleic acids are NOT DETECTED. The SARS-CoV-2 RNA is generally detectable in upper and lower respiratory specimens during the acute phase of infection. Negative results do not preclude SARS-CoV-2 infection, do not rule out co-infections with other pathogens, and should not be used as the sole basis for treatment or other patient management decisions. Negative results must be combined with clinical observations, patient history, and epidemiological information. The expected result is Negative. Fact Sheet for Patients: SugarRoll.be Fact Sheet for Healthcare Providers: https://www.woods-mathews.com/ This test  is not yet approved or cleared by the [place] FDA and  has been authorized for detection and/or diagnosis of SARS-CoV-2 by FDA under an Emergency Use Authorization (EUA). This EUA will remain  in effect (meaning this test can be used) for the duration of the COVID-19 declaration under Section 56 4(b)(1) of the Act, 21 U.S.C. section 360bbb-3(b)(1), unless the authorization is terminated or revoked sooner. Performed at [facility], [place] [address], [place], [place] [address]      Studies:  Recent x-ray studies have been reviewed in detail by the Attending Physician  Time spent :      Erin Hearing, [place] [facility] Office  [phone] Pager [phone]   **If unable to reach the above provider after paging please contact the [facility] @ [phone]  On-Call/Text Page:      [email or web address]      password [email or web address]  If 7PM-7AM, please contact night-coverage [email or web address] Password [email or web address] 07/09/2019, 2:12 PM   LOS: 7 days

## 2019-07-09 NOTE — Plan of Care (Signed)
  Problem: Education: Goal: Knowledge of General Education information will improve Description: Including pain rating scale, medication(s)/side effects and non-pharmacologic comfort measures Outcome: Progressing   Problem: Clinical Measurements: Goal: Respiratory complications will improve Outcome: Progressing Goal: Cardiovascular complication will be avoided Outcome: Progressing   Problem: Skin Integrity: Goal: Risk for impaired skin integrity will decrease Outcome: Progressing

## 2019-07-10 LAB — GLUCOSE, CAPILLARY
Glucose-Capillary: 365 mg/dL — ABNORMAL HIGH (ref 70–99)
Glucose-Capillary: 360 mg/dL — ABNORMAL HIGH (ref 70–99)

## 2019-07-10 MED ORDER — ALPRAZOLAM 0.5 MG PO TABS: 0.5000 mg | ORAL_TABLET | Freq: Two times a day (BID) | ORAL | 0 refills | Status: AC | PRN

## 2019-07-10 MED ORDER — OLANZAPINE 5 MG PO TABS: 5.0000 mg | ORAL_TABLET | Freq: Every day | ORAL | 0 refills | Status: AC

## 2019-07-10 MED ORDER — LISINOPRIL 2.5 MG PO TABS: 2.5000 mg | ORAL_TABLET | Freq: Every day | ORAL | 0 refills | Status: AC

## 2019-07-10 MED ORDER — ALPRAZOLAM 0.5 MG PO TABS: 0.5000 mg | ORAL_TABLET | Freq: Four times a day (QID) | ORAL | 0 refills | Status: DC

## 2019-07-10 MED ORDER — METOPROLOL TARTRATE 50 MG PO TABS: 50.0000 mg | ORAL_TABLET | Freq: Two times a day (BID) | ORAL | 0 refills | Status: AC

## 2019-07-10 MED ORDER — INSULIN GLARGINE 100 UNIT/ML ~~LOC~~ SOLN
8.0000 [IU] | Freq: Every day | SUBCUTANEOUS | Status: DC
  Filled 2019-07-10: qty 0.08

## 2019-07-10 MED ORDER — FUROSEMIDE 20 MG PO TABS: 20.0000 mg | ORAL_TABLET | Freq: Every day | ORAL | 0 refills | Status: AC

## 2019-07-10 NOTE — Care Management Important Message (Signed)
Important Message  Patient Details  Name: Angela Savage MRN: [id number] Date of Birth: [birth year]   Medicare Important Message Given:  Yes     Dannette Barbara 07/10/2019, 1:27 PM

## 2019-07-10 NOTE — Discharge Summary (Addendum)
Physician Discharge Summary  Angela Savage [id number] DOB: 08/26/1932 DOA: 07/01/2019  PCP: Remi Haggard, FNP  Admit date: 07/01/2019 Discharge date: 07/10/2019  Recommendations for Outpatient Follow-up:  1. Discharge to SNF with PT/OT 2. Follow up with palliative care as outpatient. 3. Follow up with PCP in 7-10 days after discharge to SNF. 4. Follow up with cardiology as directed.   Contact information for follow-up providers    [facility] Follow up on 07/21/2019.   Specialty: Cardiology Why: at 12:00pm. Please enter through the [facility] entrance Contact information: [place], [place], FNP Follow up in 2 week(s).   Specialty: Family Medicine Why: After discharge from SNF. Contact information: [place] [address] [phone]        [place] Follow up.            Contact information for after-discharge care    Destination    [facility].   Service: Skilled Nursing Contact information: [address] [place] [phone]                   Discharge Diagnoses: Principal diagnosis is #1 1. Acute on chronic diastolic congestive heart failure 2. Physical deconditioning 3. Paroxysmal atrial flutter with RVR 4. Delirium/sundowning 5. Hypertension 6. DM II 7. Parkinson's Disease with chronic tremors 8. GERD 9. Severe protein calorie malnutrition/dysphagia  Discharge Condition: Fair  Disposition: SNF  Diet recommendation: Heart healthy/Carbohydrate controlled.  Filed Weights   07/08/19 0355 07/09/19 0526 07/10/19 0401  Weight: 44.9 kg 43.8 kg 42.6 kg    History of present illness:  Angela Savage  is a 85 y.o. female who presents with chief complaint as above.  Patient presents the ED with a complaint of weakness and  chest discomfort.  Her son states that she told him her chest was hurting this afternoon and to call 911.  Here in the ED tonight she elaborates quite a bit on prior GI complaints and test that she has had.  She states that her chest bothers her when she feels like she needs to burp but cannot.  Work-up otherwise is largely within normal limits, except for some mild pulmonary edema on x-ray which is new for her.  Hospitalist were called for admission for work-up for the same.  Hospital Course:  [name] presents with chief complaint as above. Patient presents the ED with a complaint of weakness and chest discomfort. Her son states that she told him her chest was hurting this afternoon and to call 911. Here in the ED tonight she elaborates quite a bit on prior GI complaints and test that she has had. She states that her chest bothers her when she feels like she needs to burp but cannot. Work-up otherwise is largely within normal limits,except for some mild pulmonary edema on x-ray which is new for her. Hospitalist were called for admission for work-up for the same.  While in the hospital patient being treated for acute on chronic diastolic congestive heart failure exacerbation, a flutter/fib with RVR and metabolic encephalopathy. Stable now continue to work with physical therapy for gait mobility balance and transfer. With planned posthospitalization continuation of therapy  Today's assessment: S: The patient is resting comfortably. No new complaints. O: Vitals:  Vitals:   07/10/19 0816 07/10/19 1143  BP: 108/65   Pulse: Marland Kitchen)  129 89  Resp:    Temp: 98.1 F (36.7 C)   SpO2: 95%    Exam:  Constitutional:  . The patient is awake, alert, and oriented x 3. No acute distress. Respiratory:  . No increased work of breathing. . No wheezes, rales, or rhonchi . No tactile fremitus Cardiovascular:  . Regular rate and rhythm . No murmurs, ectopy, or gallups.  . No lateral PMI. No thrills. Abdomen:  . Abdomen is soft, non-tender, non-distended . No hernias, masses, or organomegaly . Normoactive bowel sounds.  Musculoskeletal:  . No cyanosis, clubbing, or edema Skin:  . No rashes, lesions, ulcers . palpation of skin: no induration or nodules Neurologic:  . CN 2-12 intact . Sensation all 4 extremities intact Psychiatric:  . Mental status o Mood, affect appropriate o Orientation to person, place, time  . judgment and insight appear intact  Discharge Instructions  Discharge Instructions    (HEART FAILURE PATIENTS) Call MD:  Anytime you have any of the following symptoms: 1) 3 pound weight gain in 24 hours or 5 pounds in 1 week 2) shortness of breath, with or without a dry hacking cough 3) swelling in the hands, feet or stomach 4) if you have to sleep on extra pillows at night in order to breathe.   Complete by: As directed    Call MD for:  difficulty breathing, headache or visual disturbances   Complete by: As directed    Call MD for:  extreme fatigue   Complete by: As directed    Call MD for:  persistant dizziness or light-headedness   Complete by: As directed    Diet - low sodium heart healthy   Complete by: As directed    Diet Carb Modified   Complete by: As directed    Discharge instructions   Complete by: As directed    Follow up with PCP in 7-10 days after discharge from SNF. Follow up with palliative care as outpatient. Follow up with cardiology as directed. Discharge to SNF. PT/OT in SNF.   Heart Failure patients record your daily weight using the same scale at the same time of day   Complete by: As directed    Increase activity slowly   Complete by: As directed      Allergies as of 07/10/2019      Reactions   Avandia [rosiglitazone Maleate] Other (See Comments)   Rapid heart rate   Carbidopa-levodopa    Ciprofloxacin    Heart problems   Codeine    Glucovance [glyburide-metformin] Other (See Comments)   Rapid  heart beat   Rosiglitazone Other (See Comments), Palpitations   Chest pain.      Medication List    STOP taking these medications   hydrochlorothiazide 25 MG tablet Commonly known as: HYDRODIURIL   metoprolol succinate 50 MG 24 hr tablet Commonly known as: TOPROL-XL   pramipexole 1.5 MG tablet Commonly known as: MIRAPEX     TAKE these medications   acetaminophen 325 MG tablet Commonly known as: TYLENOL Take 650 mg by mouth every 4 (four) hours as needed.   Alka-Seltzer Heartburn + Gas 750-80 MG Chew Generic drug: Calcium Carbonate-Simethicone Chew 1 tablet by mouth as needed.   ALPRAZolam 0.5 MG tablet Commonly known as: XANAX Take 1 tablet (0.5 mg total) by mouth 2 (two) times daily as needed for anxiety. What changed:   medication strength  how much to take  when to take this  reasons to take this   aspirin 81  MG tablet Take 81 mg by mouth daily.   AZO BLADDER CONTROL/GO-LESS PO Take 1 tablet by mouth 2 (two) times a week.   bismuth subsalicylate 99991111 [id number] suspension Commonly known as: PEPTO BISMOL Take by mouth as needed.   feeding supplement (ENSURE ENLIVE) Liqd Take 237 mLs by mouth 2 (two) times daily between meals.   fluticasone 50 MCG/ACT nasal spray Commonly known as: FLONASE Place 2 sprays into both nostrils daily.   furosemide 20 MG tablet Commonly known as: LASIX Take 1 tablet (20 mg total) by mouth daily. Start taking on: July 11, 2019   GAVISCON PO Take 1 tablet by mouth 3 (three) times daily before meals.   glimepiride 4 MG tablet Commonly known as: AMARYL Take 4 mg by mouth daily with breakfast.   glucose blood test strip Accu-Chek Compact Test strips   Lantus SoloStar 100 UNIT/ML Solostar Pen Generic drug: Insulin Glargine Inject 8 Units into the skin daily at 12 noon.   lisinopril 2.5 MG tablet Commonly known as: Zestril Take 1 tablet (2.5 mg total) by mouth daily.   metFORMIN 1000 MG tablet Commonly known as:  GLUCOPHAGE Take 1,000 mg by mouth 2 (two) times daily with a meal. What changed: Another medication with the same name was removed. Continue taking this medication, and follow the directions you see here.   metoprolol tartrate 50 MG tablet Commonly known as: LOPRESSOR Take 1 tablet (50 mg total) by mouth 2 (two) times daily.   multivitamin with minerals Tabs tablet Take 1 tablet by mouth daily.   OLANZapine 5 MG tablet Commonly known as: ZYPREXA Take 1 tablet (5 mg total) by mouth at bedtime.   potassium chloride SA 20 MEQ tablet Commonly known as: KLOR-CON Take 20 mEq by mouth daily.   sucralfate 1 g tablet Commonly known as: CARAFATE Take 1 g by mouth 4 (four) times daily.   Vitamin D3 125 MCG (5000 UT) Caps Take by mouth. Take 1 capsule 3 times a week        Allergies  Allergen Reactions  . Avandia [Rosiglitazone Maleate] Other (See Comments)    Rapid heart rate  . Carbidopa-Levodopa   . Ciprofloxacin     Heart problems   . Codeine   . Glucovance [Glyburide-Metformin] Other (See Comments)    Rapid heart beat  . Rosiglitazone Other (See Comments) and Palpitations    Chest pain.    The results of significant diagnostics from this hospitalization (including imaging, microbiology, ancillary and laboratory) are listed below for reference.    Significant Diagnostic Studies: Dg Chest 1 View  Result Date: 07/05/2019 CLINICAL DATA:  Shortness of breath. EXAM: CHEST  1 VIEW COMPARISON:  07/01/2019 FINDINGS: Normal sized heart. Mildly tortuous and calcified thoracic aorta. Clear lungs. The lungs remain hyperexpanded with stable diffuse peribronchial thickening and accentuation of the interstitial markings. Right axillary surgical clips are again demonstrated. Diffuse osteopenia. IMPRESSION: No acute abnormality. Stable changes of COPD and chronic bronchitis. Electronically Signed   By: Claudie Revering M.D.   On: 07/05/2019 10:07   Ct Head Wo Contrast  Result Date:  07/05/2019 CLINICAL DATA:  Altered level of consciousness. Increased confusion. EXAM: CT HEAD WITHOUT CONTRAST TECHNIQUE: Contiguous axial images were obtained from the base of the skull through the vertex without intravenous contrast. COMPARISON:  10/08/2018 FINDINGS: Brain: No evidence of acute infarction, hemorrhage, hydrocephalus, extra-axial collection or mass lesion/mass effect. There is ventricular sulcal enlargement consistent with mild diffuse atrophy. Patchy white matter hypoattenuation is noted bilaterally  consistent with mild chronic microvascular ischemic change. These findings are stable. Vascular: No hyperdense vessel or unexpected calcification. Skull: Normal. Negative for fracture or focal lesion. Sinuses/Orbits: Globes and orbits are unremarkable. Visualized sinuses and mastoid air cells are clear. Other: None. IMPRESSION: 1. No acute intracranial abnormalities. 2. Mild atrophy and chronic microvascular ischemic change. Electronically Signed   By: Lajean Manes M.D.   On: 07/05/2019 15:33   Ct Abdomen Pelvis W Contrast  Result Date: 07/01/2019 CLINICAL DATA:  Abdominal pain and failure to thrive EXAM: CT ABDOMEN AND PELVIS WITH CONTRAST TECHNIQUE: Multidetector CT imaging of the abdomen and pelvis was performed using the standard protocol following bolus administration of intravenous contrast. CONTRAST:  61mL OMNIPAQUE IOHEXOL 300 MG/ML  SOLN COMPARISON:  10/07/2018 FINDINGS: Lower chest: No acute abnormality. Hepatobiliary: Fatty infiltration of the liver is noted. The gallbladder is unremarkable. Pancreas: Unremarkable. No pancreatic ductal dilatation or surrounding inflammatory changes. Spleen: Normal in size without focal abnormality. Adrenals/Urinary Tract: Adrenal glands are within normal limits. Kidneys are well visualize within normal enhancement pattern. No renal calculi are seen. A dominant cyst is again noted within the right kidney measuring approximately 5.4 cm. This is stable  from the prior exam. Smaller lateral right renal cyst is noted as well stable from the prior study. Bladder is partially distended. Stomach/Bowel: The appendix is not visualized and may have been surgically removed. No inflammatory changes are seen. Scattered diverticular change of the colon is noted. The stomach and small bowel show no acute abnormality. Vascular/Lymphatic: Aortic atherosclerosis. No enlarged abdominal or pelvic lymph nodes. Reproductive: Status post hysterectomy. No adnexal masses. Other: No abdominal wall hernia or abnormality. No abdominopelvic ascites. Musculoskeletal: Postsurgical changes in the proximal left femur are seen. Degenerative changes of the lumbar spine are noted. IMPRESSION: Chronic changes stable from the prior exam. No acute abnormality noted. Electronically Signed   By: Inez Catalina M.D.   On: 07/01/2019 20:37   Dg Chest Portable 1 View  Result Date: 07/01/2019 CLINICAL DATA:  Abdominal pain and weakness EXAM: PORTABLE CHEST 1 VIEW COMPARISON:  10/07/2018 FINDINGS: Cardiac shadow is within normal limits. Aortic calcifications are seen. Mild vascular congestion is noted with interstitial edema consistent with CHF. No focal infiltrate or sizable effusion is noted. No acute bony abnormality is seen. Postsurgical changes in the right axilla are again noted. IMPRESSION: Changes of mild CHF new from the prior exam. Electronically Signed   By: Inez Catalina M.D.   On: 07/01/2019 21:10    Microbiology: Recent Results (from the past 240 hour(s))  SARS CORONAVIRUS 2 (TAT 6-24 HRS) Nasopharyngeal Nasopharyngeal Swab     Status: None   Collection Time: 07/01/19  8:17 PM   Specimen: Nasopharyngeal Swab  Result Value Ref Range Status   SARS Coronavirus 2 NEGATIVE NEGATIVE Final    Comment: (NOTE) SARS-CoV-2 target nucleic acids are NOT DETECTED. The SARS-CoV-2 RNA is generally detectable in upper and lower respiratory specimens during the acute phase of infection. Negative  results do not preclude SARS-CoV-2 infection, do not rule out co-infections with other pathogens, and should not be used as the sole basis for treatment or other patient management decisions. Negative results must be combined with clinical observations, patient history, and epidemiological information. The expected result is Negative. Fact Sheet for Patients: SugarRoll.be Fact Sheet for Healthcare Providers: https://www.woods-mathews.com/ This test is not yet approved or cleared by the [place] FDA and  has been authorized for detection and/or diagnosis of SARS-CoV-2 by FDA under an Emergency  Use Authorization (EUA). This EUA will remain  in effect (meaning this test can be used) for the duration of the COVID-19 declaration under Section 56 4(b)(1) of the Act, 21 U.S.C. section 360bbb-3(b)(1), unless the authorization is terminated or revoked sooner. Performed at [facility], [place] [address] [place], [place] [address]   SARS CORONAVIRUS 2 (TAT 6-24 HRS) Nasopharyngeal Nasopharyngeal Swab     Status: None   Collection Time: 07/07/19 10:28 AM   Specimen: Nasopharyngeal Swab  Result Value Ref Range Status   SARS Coronavirus 2 NEGATIVE NEGATIVE Final    Comment: (NOTE) SARS-CoV-2 target nucleic acids are NOT DETECTED. The SARS-CoV-2 RNA is generally detectable in upper and lower respiratory specimens during the acute phase of infection. Negative results do not preclude SARS-CoV-2 infection, do not rule out co-infections with other pathogens, and should not be used as the sole basis for treatment or other patient management decisions. Negative results must be combined with clinical observations, patient history, and epidemiological information. The expected result is Negative. Fact Sheet for Patients: SugarRoll.be Fact Sheet for Healthcare Providers: https://www.woods-mathews.com/ This test  is not yet approved or cleared by the [place] FDA and  has been authorized for detection and/or diagnosis of SARS-CoV-2 by FDA under an Emergency Use Authorization (EUA). This EUA will remain  in effect (meaning this test can be used) for the duration of the COVID-19 declaration under Section 56 4(b)(1) of the Act, 21 U.S.C. section 360bbb-3(b)(1), unless the authorization is terminated or revoked sooner. Performed at [facility], [place] [address] [place], [place] [address]      Labs: Basic Metabolic Panel: Recent Labs  Lab 07/04/19 [phone] 07/05/19 0640 07/06/19 0543 07/07/19 0522 07/08/19 0511 07/09/19 0632  NA 138 140 138 138  --  138  K 4.1 3.5 3.4* 3.5  --  3.9  CL 97* 98 96* 94*  --  95*  CO2 27 31 28  34*  --  25  GLUCOSE 278* 230* 138* 173*  --  260*  BUN 20 21 20 19   --  31*  CREATININE 0.77 0.61 0.59 0.75 0.63 0.82  CALCIUM 9.7 9.3 9.1 9.4  --  9.3  MG  --   --   --   --   --  2.0   Liver Function Tests: No results for input(s): AST, ALT, ALKPHOS, BILITOT, PROT, ALBUMIN in the last 168 hours. No results for input(s): LIPASE, AMYLASE in the last 168 hours. No results for input(s): AMMONIA in the last 168 hours. CBC: No results for input(s): WBC, NEUTROABS, HGB, HCT, MCV, PLT in the last 168 hours. Cardiac Enzymes: No results for input(s): CKTOTAL, CKMB, CKMBINDEX, TROPONINI in the last 168 hours. BNP: BNP (last 3 results) No results for input(s): BNP in the last 8760 hours.  ProBNP (last 3 results) No results for input(s): PROBNP in the last 8760 hours.  CBG: Recent Labs  Lab 07/09/19 1153 07/09/19 1703 07/09/19 2053 07/10/19 0817 07/10/19 1203  GLUCAP 226* 191* 223* 365* 360*    Principal Problem:   Pulmonary edema Active Problems:   HTN (hypertension)   Type 2 diabetes mellitus without complication, without long-term current use of insulin (HCC)   Parkinson's disease (HCC)   GERD (gastroesophageal reflux disease)   Acute CHF  (congestive heart failure) (HCC)   Pressure injury of skin   Protein-calorie malnutrition, severe   Time coordinating discharge: 38 minutes.  Signed:        Bexley Mclester, DO [facility]  07/10/2019, 1:22 PM

## 2019-07-10 NOTE — Progress Notes (Signed)
Discharged via EMS. Tele box removed.

## 2019-07-10 NOTE — TOC Transition Note (Signed)
Transition of Care [facility]) - CM/SW Discharge Note   Patient Details  Name: Angela Savage MRN: [id number] Date of Birth: May 26, 1932  Transition of Care [facility]) CM/SW Contact:  Ross Ludwig, LCSW Phone Number: 07/10/2019, 5:45 PM   Clinical Narrative:    Patient will be discharging to [facility] SNF today with palliative to follow.  CSW contacted Santiago Glad from [employer] and she is aware of the referral for palliative outpatient.  CSW updated patient's son, and bedside nurse.  Patient to be d/c'ed today to [facility], patient and family agreeable to plans will transport via ems RN to call report. Patient's son is at bedside and aware of patient discharging today.   Final next level of care: Skilled Nursing Facility Barriers to Discharge: Barriers Resolved   Patient Goals and CMS Choice Patient states their goals for this hospitalization and ongoing recovery are:: To go to SNF for short term rehab, and then potentially return back home. CMS Medicare.gov Compare Post Acute Care list provided to:: Patient Represenative (must comment) Choice offered to / list presented to : [facility] POA / Guardian, Adult Children  Discharge Placement   Existing PASRR number confirmed : 07/08/19            Patient to be transferred to facility by: [facility] Name of family member notified: Patient's son Herbie [place] Patient and family notified of of transfer: 07/10/19  Discharge Plan and Services     Post Acute Care Choice: [place]          DME Arranged: N/A DME Agency: NA       HH Arranged: NA HH Agency: NA        Social Determinants of Health (SDOH) Interventions     Readmission Risk Interventions Readmission Risk Prevention Plan 07/05/2019  Post Dischage Appt Complete  Medication Screening Complete  Transportation Screening Complete  Some recent data might be hidden

## 2019-07-10 NOTE — Progress Notes (Signed)
Inpatient Diabetes Program Recommendations  AACE/ADA: New Consensus Statement on Inpatient Glycemic Control (2015)  Target Ranges:  Prepandial:   less than 140 mg/dL      Peak postprandial:   less than 180 mg/dL (1-2 hours)      Critically ill patients:  140 - 180 mg/dL   Results for DARETH, ABRESCH (MRN [id number]) as of 07/10/2019 11:57  Ref. Range 07/09/2019 08:19 07/09/2019 11:53 07/09/2019 17:03 07/09/2019 20:53 07/10/2019 08:17  Glucose-Capillary Latest Ref Range: 70 - 99 mg/dL 257 (H)  Novolog 5 units 226 (H)  Novolog 3 units 191 (H)  Novolog 2 units 223 (H) 365 (H)  Novolog 9 units   Review of Glycemic Control Diabetes history: DM2 Outpatient Diabetes medications: Lantus 8 units daily, Metformin 1000 mg BID, Amaryl 4 mg daily Current orders for Inpatient glycemic control: Novolog 0-9 units TID with meals, Novolog 0-5 units QHS  Inpatient Diabetes Program Recommendations:   Insulin-Basal: Fasting glucose 365 mg/dl this morning. Please consider ordering Levemir 5 units Q24H  Thanks, Barnie Alderman, RN, MSN, CDE Diabetes Coordinator Inpatient Diabetes Program [phone] (Team Pager from 8am to [place])

## 2019-07-10 NOTE — Progress Notes (Signed)
Discharge paperwork completed for patient to go to [facility]. Report called to Catalina Lunger, RN at [facility] and all questions/concerns addressed at this time. EMS called for transport and patient is 4th in line for pick-up. Nursing staff will continue to monitor for any changes in patient status.   Earleen Reaper, RN

## 2019-07-11 ENCOUNTER — Ambulatory Visit: Payer: Medicare Other | Admitting: Family

## 2019-07-18 ENCOUNTER — Non-Acute Institutional Stay: Payer: Medicare Other | Admitting: Primary Care

## 2019-07-18 ENCOUNTER — Other Ambulatory Visit: Payer: Self-pay

## 2019-07-18 DIAGNOSIS — Z515 Encounter for palliative care: Secondary | ICD-10-CM

## 2019-07-18 NOTE — Progress Notes (Deleted)
   Patient ID: Angela Savage, female    DOB: Apr 19, 1932, 85 y.o.   MRN: [id number]  HPI  Angela Savage is a 85 y/o female with a history of  Echo report from 07/02/2019 reviewed and showed an EF of 65-70% along with trace MR and mild AR.  Admitted 07/01/2019 due to chest pain, acute on chronic heart failure and atrial fibrillation. Palliative care consult obtained. Discharged after 9 days to SNF.   She presents today for her initial visit with a chief complaint of     Review of Systems    Physical Exam  Assessment & Plan:  1: Chronic heart failure with preserved ejection fraction- - NYHA class  2: HTN- - BP - BMP from 07/09/2019 reviewed and showed sodium 138, potassium 3.9, creatinine 0.82 and GFR >60  3: DM- - A1c 07/02/2019 was 6.2%

## 2019-07-18 NOTE — Progress Notes (Signed)
[facility] Palliative Care Consult Note Telephone: [phone]  Fax: [phone]  TELEHEALTH VISIT STATEMENT Due to the COVID-19 crisis, this visit was done via telemedicine from my office. It was initiated and consented to by this patient and/or family.  PATIENT NAME: Angela Savage [id number] [phone] (home)  DOB: November 28, 1931 MRN: [id number]  PRIMARY CARE PROVIDER:  Lenor Coffin MD REFERRING PROVIDER: Lenor Coffin MD  RESPONSIBLE PARTY:   Extended Emergency Contact Information Primary Emergency Contact: Angela Savage [place] of [place] Phone: [phone] Mobile Phone: [phone] Relation: Son   ASSESSMENT AND RECOMMENDATIONS:   1. Advance Care Planning/Goals of Care: Goals include to maximize quality of life and symptom management. Spoke with son Angela Savage re: his mother's illness. Son does not have actual POA, will, or living will.  There are 5 living siblings. We discussed MOST form and he will receive it to discuss with his siblings.   2. Symptom Management:   Abdominal pain:  Endorses diarrhea and abdominal pain. She is upset because she has had diarrhea. She appears to be a poor historian. Staff located and states she has been agitated and confused.  Meds: States she spit out pills and nurse said she would not take them. She appears confused. She states she spit out 3 pink pills. RN cannot locate these in the bed. Recommend observing patient taking all meds.   Mobility: Son states she's been living alone and he checks on her daily, gives her meds but she's alone most of the day. She needs more mobility to be able to live (I). I would recommend a placement discussion.  3. Family /Caregiver/Community Supports: Has son Angela Savage in the area. She is newly admitted to [facility] for skilled care.   4. Cognitive / Functional decline: Alert, oriented x 1. Agitated about her medications. Appears to need  assistance in all adls, iadls.  5. Follow up Palliative Care Visit: Palliative care will continue to follow for goals of care clarification and symptom management. Return 2-4 weeks or prn.  I spent 35 minutes providing this consultation,  from 1500 to 1535. More than 50% of the time in this consultation was spent coordinating communication.   HISTORY OF PRESENT ILLNESS:  Angela Savage is a 85 y.o. year old female with multiple medical problems including Heart failure acute on chronic, Parkinson's disease, DM, anxiety and depression, h/o breast cancer. Palliative Care was asked to follow this patient by consultation request of Lenor Coffin MD to help address advance care planning and goals of care. This is the initial  visit.  CODE STATUS: TBD  PPS: 30% HOSPICE ELIGIBILITY/DIAGNOSIS: TBD  PAST MEDICAL HISTORY:  Past Medical History:  Diagnosis Date  . Allergy   . Anemia   . Anxiety and depression   . Breast cancer ([place])   . Cataracts, bilateral   . Diabetes mellitus   . Diverticulitis   . Endometrial cancer ([place])   . GERD (gastroesophageal reflux disease)   . Hyperlipidemia   . Hypertension   . Parkinson's disease ([place])   . Peptic ulcer   . Tremor   . Umbilical hernia     SOCIAL HX:  Social History   Tobacco Use  . Smoking status: Never Smoker  . Smokeless tobacco: Never Used  Substance Use Topics  . Alcohol use: No    ALLERGIES:  Allergies  Allergen Reactions  . Avandia [Rosiglitazone Maleate] Other (See Comments)    Rapid heart rate  .  Carbidopa-Levodopa   . Ciprofloxacin     Heart problems   . Codeine   . Glucovance [Glyburide-Metformin] Other (See Comments)    Rapid heart beat  . Rosiglitazone Other (See Comments) and Palpitations    Chest pain.     PERTINENT MEDICATIONS:  Outpatient Encounter Medications as of 07/18/2019  Medication Sig  . acetaminophen (TYLENOL) 325 MG tablet Take 650 mg by mouth every 4 (four) hours as needed.   . ALPRAZolam (XANAX)  0.5 MG tablet Take 1 tablet (0.5 mg total) by mouth 2 (two) times daily as needed for anxiety.  . Alum Hydroxide-Mag Carbonate (GAVISCON PO) Take 1 tablet by mouth 3 (three) times daily before meals.  Marland Kitchen aspirin 81 MG tablet Take 81 mg by mouth daily.  Marland Kitchen bismuth subsalicylate (PEPTO BISMOL) 262 MG/15ML suspension Take by mouth as needed.  . Calcium Carbonate-Simethicone (ALKA-SELTZER HEARTBURN + GAS) 750-80 MG CHEW Chew 1 tablet by mouth as needed.  . Cholecalciferol (VITAMIN D3) 5000 units CAPS Take by mouth. Take 1 capsule 3 times a week  . feeding supplement, ENSURE ENLIVE, (ENSURE ENLIVE) LIQD Take 237 mLs by mouth 2 (two) times daily between meals. (Patient not taking: Reported on 07/01/2019)  . fluticasone (FLONASE) 50 MCG/ACT nasal spray Place 2 sprays into both nostrils daily.   . furosemide (LASIX) 20 MG tablet Take 1 tablet (20 mg total) by mouth daily.  Marland Kitchen glimepiride (AMARYL) 4 MG tablet Take 4 mg by mouth daily with breakfast.  . glucose blood test strip Accu-Chek Compact Test strips  . LANTUS SOLOSTAR 100 UNIT/ML Solostar Pen Inject 8 Units into the skin daily at 12 noon.   Marland Kitchen lisinopril (ZESTRIL) 2.5 MG tablet Take 1 tablet (2.5 mg total) by mouth daily.  . metFORMIN (GLUCOPHAGE) 1000 MG tablet Take 1,000 mg by mouth 2 (two) times daily with a meal.    . metoprolol tartrate (LOPRESSOR) 50 MG tablet Take 1 tablet (50 mg total) by mouth 2 (two) times daily.  . Multiple Vitamin (MULTIVITAMIN WITH MINERALS) TABS tablet Take 1 tablet by mouth daily. (Patient not taking: Reported on 07/01/2019)  . OLANZapine (ZYPREXA) 5 MG tablet Take 1 tablet (5 mg total) by mouth at bedtime.  . potassium chloride SA (K-DUR,KLOR-CON) 20 MEQ tablet Take 20 mEq by mouth daily.   . Pumpkin Seed-Soy Germ (AZO BLADDER CONTROL/GO-LESS PO) Take 1 tablet by mouth 2 (two) times a week.   . sucralfate (CARAFATE) 1 g tablet Take 1 g by mouth 4 (four) times daily.   No facility-administered encounter medications on  file as of 07/18/2019.     PHYSICAL EXAM / ROS with SNF staff reporting:   135/80  97.5-82-18 from SNF records Current and past weights: 98.4 lbs 07/11/2019 General: NAD, frail appearing, thin Cardiovascular: no chest pain reported, no edema  Pulmonary: no cough, no increased SOB, 97% RA per records Abdomen: appetite fair, eats 25-50%, must be fed denies constipation, incontinent of bowel. Post prandial glucoses running high 200-300 and FBS is low < 100.  GU: denies dysuria, incontinent of urine MSK:  no joint deformities, non ambulatory Skin: red area left buttock noted by SNF staff Neurological: Weakness, tremors, confusion  Cyndia Skeeters DNP AGPCNP-BC

## 2019-07-21 ENCOUNTER — Ambulatory Visit: Payer: Medicare Other | Admitting: Family

## 2019-08-08 ENCOUNTER — Telehealth: Payer: Self-pay | Admitting: Family

## 2019-08-08 ENCOUNTER — Ambulatory Visit: Payer: Medicare Other | Admitting: Family

## 2019-08-08 NOTE — Telephone Encounter (Signed)
Patient did not show for her [facility] appointment on 08/08/2019. Will attempt to reschedule.

## 2019-08-15 ENCOUNTER — Non-Acute Institutional Stay: Payer: Medicare Other | Admitting: Primary Care

## 2019-09-02 ENCOUNTER — Telehealth: Payer: Self-pay | Admitting: Family

## 2019-09-02 NOTE — Telephone Encounter (Signed)
Called to R/S a New Patient Appt for her as she missed her previously scheduled appt on 12/4. Phone is Disconnected!    Angela Savage, [place]

## 2021-03-04 DEATH — deceased

## 2021-05-21 IMAGING — DX DG CHEST 1V
1 series · 1 of 1 positions shown · non-contrast
Comparison: 07/01/2019

CLINICAL DATA: Shortness of breath.

EXAM:
CHEST  1 VIEW

[chest ap]
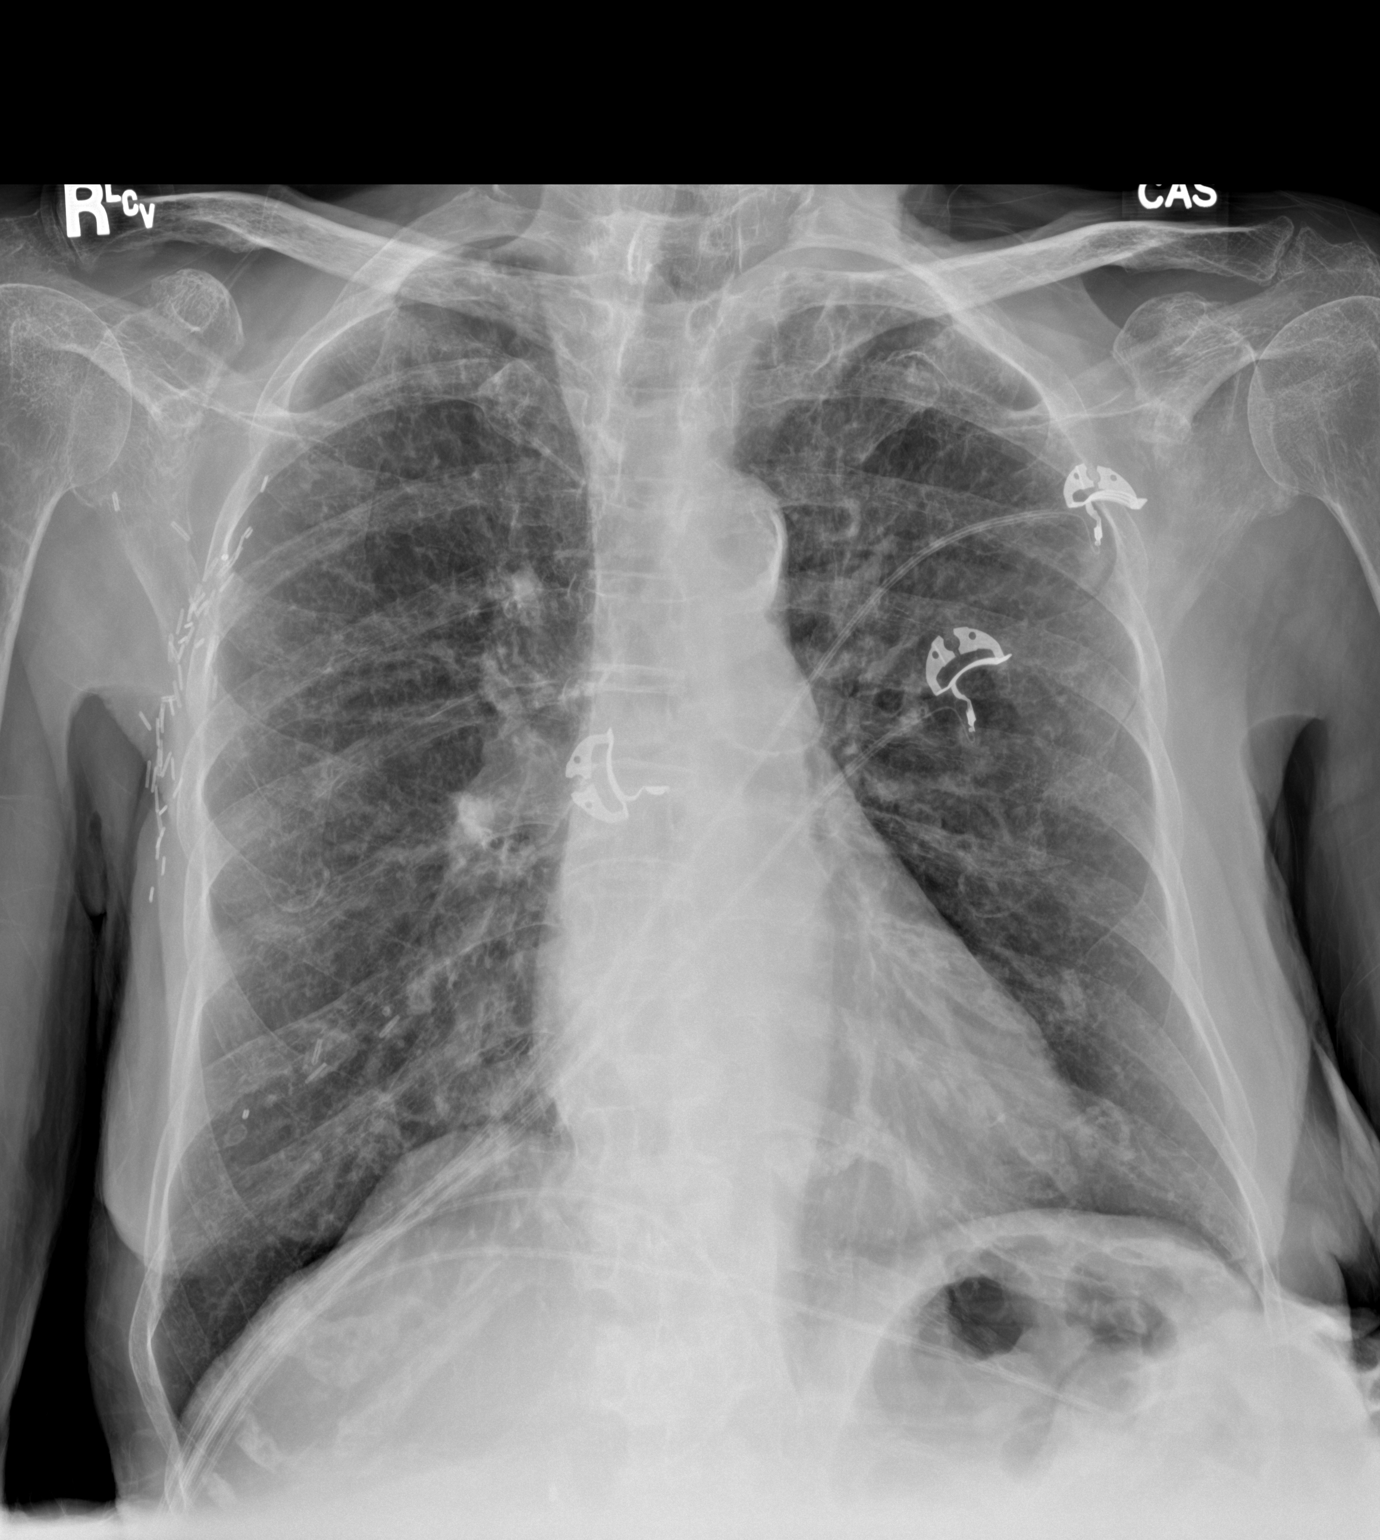

[1 of 1 positions shown; findings below may reference images not displayed]

FINDINGS: Normal sized heart. Mildly tortuous and calcified thoracic aorta.
Clear lungs. The lungs remain hyperexpanded with stable diffuse
peribronchial thickening and accentuation of the interstitial
markings. Right axillary surgical clips are again demonstrated.
Diffuse osteopenia.
IMPRESSION: No acute abnormality. Stable changes of COPD and chronic bronchitis.
